# Patient Record
Sex: Female | Born: 1975 | Race: Black or African American | Hispanic: No | Marital: Single | Smoking: Never smoker
Health system: Southern US, Community
[De-identification: ages and names within clinical notes are randomized; demographics above are authoritative.]

## PROBLEM LIST (undated history)

## (undated) DIAGNOSIS — B999 Unspecified infectious disease: Secondary | ICD-10-CM

## (undated) DIAGNOSIS — I10 Essential (primary) hypertension: Secondary | ICD-10-CM

## (undated) HISTORY — PX: TUBAL LIGATION: SHX77

## (undated) HISTORY — DX: Essential (primary) hypertension: I10

---

## 2012-05-17 ENCOUNTER — Encounter: Payer: Self-pay | Admitting: Obstetrics and Gynecology

## 2012-05-17 ENCOUNTER — Inpatient Hospital Stay (HOSPITAL_COMMUNITY)
Admission: AD | Admit: 2012-05-17 | Discharge: 2012-05-17 | Disposition: A | Discharge status: Home / Self Care | Payer: Self-pay | Source: Ambulatory Visit | Attending: Obstetrics and Gynecology | Admitting: Obstetrics and Gynecology

## 2012-05-17 ENCOUNTER — Encounter (HOSPITAL_COMMUNITY): Payer: Self-pay

## 2012-05-17 DIAGNOSIS — N950 Postmenopausal bleeding: Secondary | ICD-10-CM

## 2012-05-17 DIAGNOSIS — K219 Gastro-esophageal reflux disease without esophagitis: Secondary | ICD-10-CM | POA: Insufficient documentation

## 2012-05-17 DIAGNOSIS — R1011 Right upper quadrant pain: Secondary | ICD-10-CM | POA: Insufficient documentation

## 2012-05-17 HISTORY — DX: Unspecified infectious disease: B99.9

## 2012-05-17 LAB — CBC
HCT: 39.2 % (ref 36.0–46.0)
MCH: 25.9 pg — ABNORMAL LOW (ref 26.0–34.0)
MCHC: 31.9 g/dL (ref 30.0–36.0)
MCV: 81.2 fL (ref 78.0–100.0)
Platelets: 176 10*3/uL (ref 150–400)
RDW: 13.1 % (ref 11.5–15.5)

## 2012-05-17 LAB — WET PREP, GENITAL
Trich, Wet Prep: NONE SEEN
Yeast Wet Prep HPF POC: NONE SEEN

## 2012-05-17 LAB — COMPREHENSIVE METABOLIC PANEL
Albumin: 4 g/dL (ref 3.5–5.2)
BUN: 10 mg/dL (ref 6–23)
Calcium: 9.7 mg/dL (ref 8.4–10.5)
Chloride: 102 mEq/L (ref 96–112)
Creatinine, Ser: 0.89 mg/dL (ref 0.50–1.10)
Total Bilirubin: 0.4 mg/dL (ref 0.3–1.2)
Total Protein: 7.6 g/dL (ref 6.0–8.3)

## 2012-05-17 LAB — URINALYSIS, ROUTINE W REFLEX MICROSCOPIC
Bilirubin Urine: NEGATIVE
Glucose, UA: NEGATIVE mg/dL
Hgb urine dipstick: NEGATIVE
Ketones, ur: NEGATIVE mg/dL
Protein, ur: NEGATIVE mg/dL
Urobilinogen, UA: 0.2 mg/dL (ref 0.0–1.0)

## 2012-05-17 MED ORDER — PROBIOTIC PO CAPS: 1.0000 | ORAL_CAPSULE | ORAL | Status: DC

## 2012-05-17 MED ORDER — RANITIDINE HCL 75 MG PO TABS: 75.0000 mg | ORAL_TABLET | Freq: Two times a day (BID) | ORAL | Status: DC

## 2012-05-17 NOTE — MAU Provider Note (Signed)
Attestation of Attending Supervision of Advanced Practitioner (CNM/NP): Evaluation and management procedures were performed by the Advanced Practitioner under my supervision and collaboration.  I have reviewed the Advanced Practitioner's note and chart, and I agree with the management and plan.  Jamie Wilcox,Jamie Wilcox 05/17/2012 11:19 PM

## 2012-05-17 NOTE — MAU Note (Signed)
Patient is in with c/o a week long generalized abdominal pain, lower pain, nausea, loss of appetite, "feverish" feeling and fatigue. She states that she did not have her period for 2 years until 04/14/2012. That lasted for 5 days.

## 2012-05-17 NOTE — MAU Provider Note (Signed)
Chief Complaint: Back Pain, Abdominal Pain, Nausea and Fatigue   First Lenton Gendreau Initiated Contact with Patient 05/17/12 1047     SUBJECTIVE HPI: Jamie Wilcox is a 37 y.o. by records but pt reports wrong DOB on VISA so is actually 37 years old G3P3005 who presents to maternity admissions reporting abdominal pain in upper and lower quadrants which is intermittent and has been occuring for ~2 weeks, but has been worse over the past few days.  This pain is worse when she has an empty stomach, and improves after eating.  She has also had nausea and lack of appetite and daily fatigue.  She had no vaginal bleeding x2 years and believed this was menopause but had 5 days of bleeding starting 04/15/12 which was heavy.  She is not bleeding today.  She does not have insurance and does not have a primary care Kaine Mcquillen or Gyn Chasin Findling at this time. She does report a diagnosis of HTN when she was younger but has not taken medication for this.  She denies chest pain, shortness of breath, vaginal itching/burning, urinary symptoms, h/a, or dizziness.  Her obstetric history is significant for 1 vaginal birth of twins, 1 c/s with twins, and a repeat C/S for singleton pregnancy. She had a BTL after her last C/S.   Past Medical History  Diagnosis Date  . Infection    Past Surgical History  Procedure Laterality Date  . Cesarean section     History   Social History  . Marital Status: Single    Spouse Name: N/A    Number of Children: N/A  . Years of Education: N/A   Occupational History  . Not on file.   Social History Main Topics  . Smoking status: Never Smoker   . Smokeless tobacco: Not on file  . Alcohol Use: No  . Drug Use: No  . Sexually Active: Not Currently    Birth Control/ Protection: None   Other Topics Concern  . Not on file   Social History Narrative  . No narrative on file   No current facility-administered medications on file prior to encounter.   No current outpatient prescriptions on  file prior to encounter.   No Known Allergies  ROS: Pertinent items in HPI  OBJECTIVE Blood pressure 147/83, pulse 77, temperature 98.5 F (36.9 C), temperature source Oral, resp. rate 18, height 5\' 9"  (1.753 m), weight 120.884 kg (266 lb 8 oz), last menstrual period 04/14/2012, SpO2 98.00%. GENERAL: Well-developed, well-nourished female in no acute distress.  HEENT: Normocephalic HEART: normal rate RESP: normal effort ABDOMEN: Soft, non-tender EXTREMITIES: Nontender, no edema NEURO: Alert and oriented Pelvic exam: Cervix pink, visually closed, without lesion, scant white creamy discharge, vaginal walls and external genitalia normal Bimanual exam: Cervix 0/long/high, firm, anterior, neg CMT, uterus nontender, nonenlarged, adnexa without tenderness, enlargement, or mass  LAB RESULTS Results for orders placed during the hospital encounter of 05/17/12 (from the past 24 hour(s))  URINALYSIS, ROUTINE W REFLEX MICROSCOPIC     Status: None   Collection Time    05/17/12  9:45 AM      Result Value Range   Color, Urine YELLOW  YELLOW   APPearance CLEAR  CLEAR   Specific Gravity, Urine 1.015  1.005 - 1.030   pH 6.5  5.0 - 8.0   Glucose, UA NEGATIVE  NEGATIVE mg/dL   Hgb urine dipstick NEGATIVE  NEGATIVE   Bilirubin Urine NEGATIVE  NEGATIVE   Ketones, ur NEGATIVE  NEGATIVE mg/dL   Protein, ur  NEGATIVE  NEGATIVE mg/dL   Urobilinogen, UA 0.2  0.0 - 1.0 mg/dL   Nitrite NEGATIVE  NEGATIVE   Leukocytes, UA NEGATIVE  NEGATIVE  POCT PREGNANCY, URINE     Status: None   Collection Time    05/17/12  9:48 AM      Result Value Range   Preg Test, Ur NEGATIVE  NEGATIVE    IMAGING No results found.  ASSESSMENT No diagnosis found.  PLAN Discharge home   Medication List     As of 05/17/2012 12:13 PM    Notice      You have not been prescribed any medications.          Sharen Counter Certified Nurse-Midwife 05/17/2012  12:13 PM  She also asks about HIV testing and blood  sugar testing and hypertension treatment. Informed her that will require long term management. Number given for [facility] for HIV testing and [facility] number [phone] for info on the new [facility] clinic applications.   A:  Abdominal pain, LUQ, probably GERD      Report of post-menopausal bleeding vs oligomenorrhea vs PCOS      Normal lab studies  P:  Discharge home       Will start on Zantac and Probiotics       Will refer to GYN clinic for endometrial biopsy  Wynelle Bourgeois CNM

## 2012-05-18 LAB — GC/CHLAMYDIA PROBE AMP: CT Probe RNA: NEGATIVE

## 2012-06-06 ENCOUNTER — Ambulatory Visit (INDEPENDENT_AMBULATORY_CARE_PROVIDER_SITE_OTHER): Payer: Self-pay | Admitting: Obstetrics and Gynecology

## 2012-06-06 ENCOUNTER — Encounter: Payer: Self-pay | Admitting: Obstetrics and Gynecology

## 2012-06-06 ENCOUNTER — Other Ambulatory Visit: Payer: Self-pay | Admitting: Obstetrics and Gynecology

## 2012-06-06 ENCOUNTER — Other Ambulatory Visit (HOSPITAL_COMMUNITY)
Admission: RE | Admit: 2012-06-06 | Discharge: 2012-06-06 | Disposition: A | Discharge status: Home / Self Care | Payer: Self-pay | Source: Ambulatory Visit | Attending: Obstetrics and Gynecology | Admitting: Obstetrics and Gynecology

## 2012-06-06 VITALS — BP 161/104 | HR 82 | Temp 97.2°F | Ht 67.25 in | Wt 267.7 lb

## 2012-06-06 DIAGNOSIS — N939 Abnormal uterine and vaginal bleeding, unspecified: Secondary | ICD-10-CM

## 2012-06-06 DIAGNOSIS — N950 Postmenopausal bleeding: Secondary | ICD-10-CM | POA: Insufficient documentation

## 2012-06-06 DIAGNOSIS — N926 Irregular menstruation, unspecified: Secondary | ICD-10-CM

## 2012-06-06 NOTE — Progress Notes (Signed)
  Subjective:    Patient ID: Jamie Wilcox, female    DOB: 06/06/1975, 37 y.o.   MRN: [id number]  HPI  Patient is actually a 37 yo G3P3005 postmenopausal for 2 years with an episode of vaginal bleeding in January which lasted for 5 days accompanied by some clots. Patient states that no further bleeding since. Patient is also complaining of intermittent LLQ pain which is short lived, stabbing in nature, non-radiating. No alleviating or aggravating factors. Patient also complaining of left lower extremity swelling for the past month. Patient is otherwise without any complaints. Patient is not sexually active as husband is not living with her [place] this [place]. Patient is not using anything for birth control.  Past Medical History  Diagnosis Date  . Infection   . Hypertension    Past Surgical History  Procedure Laterality Date  . Cesarean section     History reviewed. No pertinent family history.  History  Substance Use Topics  . Smoking status: Never Smoker   . Smokeless tobacco: Not on file  . Alcohol Use: No     Review of Systems  All other systems reviewed and are negative.       Objective:   Physical Exam  GENERAL: Well-developed, well-nourished female in no acute distress.  ABDOMEN: Soft, nontender, non-distended, obese. No organomegaly. PELVIC: Normal external female genitalia. Vagina is pink and rugated.  Normal discharge. Normal appearing cervix. Bimanual limited secondary to body habitus. No adnexal mass or tenderness. EXTREMITIES: No cyanosis, clubbing, or edema, 2+ distal pulses.     Assessment & Plan:  37 yo postmenopausal for 2 years with episode of vaginal bleeding - patient counseled on need for endometrial biopsy  ENDOMETRIAL BIOPSY     The indications for endometrial biopsy were reviewed.   Risks of the biopsy including cramping, bleeding, infection, uterine perforation, inadequate specimen and need for additional procedures  were discussed. The patient states  she understands and agrees to undergo procedure today. Consent was signed. Time out was performed. Urine HCG was negative. A sterile speculum was placed in the patient's vagina and the cervix was prepped with Betadine. A single-toothed tenaculum was placed on the anterior lip of the cervix to stabilize it. The uterine cavity was sounded to a depth of 8 cm using the uterine sound. The 3 mm pipelle was introduced into the endometrial cavity without difficulty, 2 passes were made.  A  scant amount of tissue was sent to pathology. The instruments were removed from the patient's vagina. Minimal bleeding from the cervix was noted. The patient tolerated the procedure well.  Routine post-procedure instructions were given to the patient. The patient will follow up in two weeks to review the results and for further management.   - Pelvic ultrasound also ordered - RTC in 2 weeks for results and further management if indicated - Referral to PCP provided for evaluation of LE and LLQ pain if normal ultrasound

## 2012-06-06 NOTE — Progress Notes (Signed)
Patient was seen in MAU for bleeding.  She reports she has not had a period in 2 years and all of a sudden started bleeding.  The bleeding was heavy and lasted about 5 days.  She is currently not bleeding.  Patient complains of pain on her LLQ and ULQ.  Patients lower extremities show +3 pitting edema.

## 2012-06-07 ENCOUNTER — Encounter: Payer: Self-pay | Admitting: *Deleted

## 2012-06-12 ENCOUNTER — Ambulatory Visit: Payer: Self-pay | Admitting: Family Medicine

## 2012-06-13 ENCOUNTER — Ambulatory Visit (HOSPITAL_COMMUNITY)
Admission: RE | Admit: 2012-06-13 | Discharge: 2012-06-13 | Disposition: A | Discharge status: Home / Self Care | Payer: Self-pay | Source: Ambulatory Visit | Attending: Obstetrics and Gynecology | Admitting: Obstetrics and Gynecology

## 2012-06-13 DIAGNOSIS — N938 Other specified abnormal uterine and vaginal bleeding: Secondary | ICD-10-CM | POA: Insufficient documentation

## 2012-06-13 DIAGNOSIS — N939 Abnormal uterine and vaginal bleeding, unspecified: Secondary | ICD-10-CM

## 2012-06-13 DIAGNOSIS — N949 Unspecified condition associated with female genital organs and menstrual cycle: Secondary | ICD-10-CM | POA: Insufficient documentation

## 2012-06-13 DIAGNOSIS — D251 Intramural leiomyoma of uterus: Secondary | ICD-10-CM | POA: Insufficient documentation

## 2012-06-21 ENCOUNTER — Ambulatory Visit: Payer: Self-pay | Admitting: Family Medicine

## 2012-07-04 ENCOUNTER — Ambulatory Visit: Payer: Self-pay | Admitting: Obstetrics and Gynecology

## 2012-07-24 ENCOUNTER — Other Ambulatory Visit: Payer: Self-pay | Admitting: Internal Medicine

## 2012-07-24 ENCOUNTER — Encounter: Payer: Self-pay | Admitting: Internal Medicine

## 2012-07-24 ENCOUNTER — Ambulatory Visit: Payer: Self-pay

## 2012-07-24 ENCOUNTER — Ambulatory Visit: Payer: Self-pay | Attending: Internal Medicine | Admitting: Internal Medicine

## 2012-07-24 VITALS — BP 142/82 | HR 84 | Temp 98.8°F | Resp 20 | Ht 67.0 in | Wt 264.0 lb

## 2012-07-24 DIAGNOSIS — I10 Essential (primary) hypertension: Secondary | ICD-10-CM | POA: Insufficient documentation

## 2012-07-24 DIAGNOSIS — R030 Elevated blood-pressure reading, without diagnosis of hypertension: Secondary | ICD-10-CM

## 2012-07-24 DIAGNOSIS — E669 Obesity, unspecified: Secondary | ICD-10-CM | POA: Insufficient documentation

## 2012-07-24 DIAGNOSIS — M79606 Pain in leg, unspecified: Secondary | ICD-10-CM | POA: Insufficient documentation

## 2012-07-24 DIAGNOSIS — M549 Dorsalgia, unspecified: Secondary | ICD-10-CM | POA: Insufficient documentation

## 2012-07-24 DIAGNOSIS — M79609 Pain in unspecified limb: Secondary | ICD-10-CM

## 2012-07-24 MED ORDER — NAPROXEN 500 MG PO TABS: 500.0000 mg | ORAL_TABLET | Freq: Two times a day (BID) | ORAL | Status: DC

## 2012-07-24 MED ORDER — HYDROCHLOROTHIAZIDE 25 MG PO TABS: 12.5000 mg | ORAL_TABLET | Freq: Every day | ORAL | Status: DC

## 2012-07-24 MED ORDER — TRAMADOL HCL 50 MG PO TABS: 50.0000 mg | ORAL_TABLET | Freq: Three times a day (TID) | ORAL | Status: DC | PRN

## 2012-07-24 NOTE — Progress Notes (Signed)
Patient ID: Jamie Wilcox, female   DOB: [birth year], 37 y.o.   MRN: [id number] Patient Demographics  Jamie Wilcox, is a 37 y.o. female  XLK:[id number]  [id number]  DOB - 08/01/1975  No chief complaint on file.       Subjective:   Jamie Wilcox today is here to establish primary care. Patient has No headache, No chest pain, No abdominal pain - No Nausea, No new weakness tingling or numbness, No Cough . Patient complains of mid back pain and lower extremity swelling and pain for last 1 month. Patient denies any significant shortness of breath but fatigued on walking. No orthopnea, PND or chest pain.   Objective:    Filed Vitals:   07/24/12 1658  BP: 142/82  Pulse: 84  Temp: 98.8 F (37.1 C)  TempSrc: Oral  Resp: 20  Height: 5\' 7"  (1.702 m)  Weight: 264 lb (119.75 kg)  SpO2: 97%     ALLERGIES:  No Known Allergies  PAST MEDICAL HISTORY: Past Medical History  Diagnosis Date  . Infection   . Hypertension     PAST SURGICAL HISTORY: Past Surgical History  Procedure Laterality Date  . Cesarean section      FAMILY HISTORY: No family history on file.  MEDICATIONS AT HOME: Prior to Admission medications   Medication Sig Start Date End Date Taking? Authorizing Provider  hydrochlorothiazide (HYDRODIURIL) 25 MG tablet Take 0.5 tablets (12.5 mg total) by mouth daily. 07/24/12   Dezi Schaner Jenna Luo, MD  naproxen (NAPROSYN) 500 MG tablet Take 1 tablet (500 mg total) by mouth 2 (two) times daily with a meal. 07/24/12   Keiasia Christianson K Deashia Soule, MD  PROBIOTIC CAPS Take 1 capsule by mouth 1 day or 1 dose. 05/17/12   Aviva Signs, CNM  ranitidine (ZANTAC 75) 75 MG tablet Take 1 tablet (75 mg total) by mouth 2 (two) times daily. 05/17/12   Aviva Signs, CNM  traMADol (ULTRAM) 50 MG tablet Take 1 tablet (50 mg total) by mouth every 8 (eight) hours as needed for pain. 07/24/12   Kamarie Veno Jenna Luo, MD    REVIEW OF SYSTEMS:  Constitutional:   No   Fevers, chills, fatigue.  HEENT:    No  headaches, Sore throat,   Cardio-vascular: No chest pain,  Orthopnea, swelling in lower extremities, anasarca, palpitations  GI:  No abdominal pain, nausea, vomiting, diarrhea  Resp: No shortness of breath,  No coughing up of blood.No cough.No wheezing.  Skin:  no rash or lesions.  GU:  no dysuria, change in color of urine, no urgency or frequency.  No flank pain.  Musculoskeletal: No joint pain or swelling.  No decreased range of motion.  No back pain. both LE pain and swelling  Psych: No change in mood or affect. No depression or anxiety.  No memory loss.   Exam  General appearance :Awake, alert, NAD, Speech Clear. HEENT: Atraumatic and Normocephalic, PERLA Neck: supple, no JVD. No cervical lymphadenopathy.  Chest: clear to auscultation bilaterally, no wheezing, rales or rhonchi CVS: S1 S2 regular, no murmurs.  Abdomen: soft, NBS, NT, ND, no gaurding, rigidity or rebound. Extremities: No cyanosis, clubbing, B/L Lower Ext shows trace edema,  Neurology: Awake alert, and oriented X 3, CN II-XII intact, Non focal Skin:No Rash or lesions Wounds: N/A Back: mid back, thoracic spine tenderness      Data Review   Basic Metabolic Panel: No results found for this basename: NA, K, CL, CO2, GLUCOSE, BUN, CREATININE, CALCIUM, MG, PHOS,  in the  last 168 hours Liver Function Tests: No results found for this basename: AST, ALT, ALKPHOS, BILITOT, PROT, ALBUMIN,  in the last 168 hours  CBC: No results found for this basename: WBC, NEUTROABS, HGB, HCT, MCV, PLT,  in the last 168 hours ------------------------------------------------------------------------------------------------------------------ No results found for this basename: HGBA1C,  in the last 72 hours ------------------------------------------------------------------------------------------------------------------ No results found for this basename: CHOL, HDL, LDLCALC, TRIG, CHOLHDL, LDLDIRECT,  in the last 72  hours ------------------------------------------------------------------------------------------------------------------ No results found for this basename: TSH, T4TOTAL, FREET3, T3FREE, THYROIDAB,  in the last 72 hours ------------------------------------------------------------------------------------------------------------------ No results found for this basename: VITAMINB12, FOLATE, FERRITIN, TIBC, IRON, RETICCTPCT,  in the last 72 hours  Coagulation profile  No results found for this basename: INR, PROTIME,  in the last 168 hours    Assessment & Plan   1) Mid back pain - Will obtain thoracic spine x-ray to r/o any compression fracture, DJD - place on tramadol and naprosyn  2) elevated BP reading with per edema and pain. - place on HCTZ 12.5mg  daily - obtain venous Dopplers b/l to r/o any DVT - doesnot fit the picture of CHF  3) obesity: Counseled on diet and weight control  Recommendations: f/u in 1 month  Ancel Easler M.D. 07/24/2012, 5:22 PM

## 2012-07-24 NOTE — Progress Notes (Signed)
Present with back pain and swelling in legs.

## 2012-07-26 ENCOUNTER — Ambulatory Visit (HOSPITAL_COMMUNITY): Admission: RE | Admit: 2012-07-26 | Payer: Self-pay | Source: Ambulatory Visit

## 2012-07-27 ENCOUNTER — Telehealth: Payer: Self-pay | Admitting: General Practice

## 2012-07-27 NOTE — Telephone Encounter (Signed)
Pt missed x-ray appt, which was a referral.  She was wanting to reschedule.  Thanks.

## 2012-07-31 ENCOUNTER — Ambulatory Visit (HOSPITAL_COMMUNITY)
Admission: RE | Admit: 2012-07-31 | Discharge: 2012-07-31 | Disposition: A | Discharge status: Home / Self Care | Payer: Self-pay | Source: Ambulatory Visit | Attending: Internal Medicine | Admitting: Internal Medicine

## 2012-07-31 DIAGNOSIS — M549 Dorsalgia, unspecified: Secondary | ICD-10-CM | POA: Insufficient documentation

## 2012-07-31 DIAGNOSIS — M47814 Spondylosis without myelopathy or radiculopathy, thoracic region: Secondary | ICD-10-CM | POA: Insufficient documentation

## 2012-07-31 DIAGNOSIS — M4120 Other idiopathic scoliosis, site unspecified: Secondary | ICD-10-CM | POA: Insufficient documentation

## 2012-08-13 ENCOUNTER — Ambulatory Visit: Payer: Self-pay | Admitting: Obstetrics and Gynecology

## 2012-08-24 ENCOUNTER — Ambulatory Visit: Payer: Self-pay

## 2012-09-03 ENCOUNTER — Encounter: Payer: Self-pay | Admitting: Obstetrics & Gynecology

## 2012-09-03 ENCOUNTER — Ambulatory Visit (INDEPENDENT_AMBULATORY_CARE_PROVIDER_SITE_OTHER): Payer: Self-pay | Admitting: Obstetrics & Gynecology

## 2012-09-03 VITALS — BP 150/99 | HR 77 | Temp 97.9°F | Ht 66.0 in | Wt 262.8 lb

## 2012-09-03 DIAGNOSIS — N939 Abnormal uterine and vaginal bleeding, unspecified: Secondary | ICD-10-CM

## 2012-09-03 DIAGNOSIS — I10 Essential (primary) hypertension: Secondary | ICD-10-CM

## 2012-09-03 DIAGNOSIS — M549 Dorsalgia, unspecified: Secondary | ICD-10-CM

## 2012-09-03 MED ORDER — NAPROXEN 500 MG PO TABS: 500.0000 mg | ORAL_TABLET | Freq: Two times a day (BID) | ORAL | Status: DC

## 2012-09-03 MED ORDER — HYDROCHLOROTHIAZIDE 25 MG PO TABS: 25.0000 mg | ORAL_TABLET | Freq: Every day | ORAL | Status: DC

## 2012-09-03 NOTE — Progress Notes (Signed)
GYNECOLOGY CLINIC PROGRESS NOTE  History:  37 y.o. [id number] here today for followup after endometrial biopsy by Dr. Jolayne Panther on 06/06/12 for abnormal uterine bleeding (AUB) after two years of amenorrhea.  She reports having no further bleeding since this episode.  Still complains of BLE edema and mid back pain, was seen by Dr. Isidoro Donning (PCP) and prescribed HCTZ, Naproxen and Ultram.  Apparently, patient was confused about the instructions given to her and never filled these prescriptions. She also had an Xray done in 07/2012 and wants to know the results. No GYN symptoms.  The following portions of the patient's history were reviewed and updated as appropriate: allergies, current medications, past family history, past medical history, past social history, past surgical history and problem list.  Review of Systems:  Pertinent items are noted in HPI.  Objective:  Physical Exam BP 150/99  Pulse 77  Temp(Src) 97.9 F (36.6 C) (Oral)  Ht 5\' 6"  (1.676 m)  Wt 262 lb 12.8 oz (119.205 kg)  BMI 42.44 kg/m2 Gen: NAD Back: Midback point tenderness Ext: Trace edema BLE  06/07/12 Endometrium, biopsy - SCANT SUPERFICIAL FRAGMENTS OF ENDOMETRIOID TYPE EPITHELIUM. - THERE IS NO EVIDENCE OF HYPERPLASIA OR MALIGNANCY.  07/31/12 THORACIC SPINE - 2 VIEW Clinical Data: 73-month history of interscapular back pain. No  known injuries. Comparison: None. Findings: 12 rib-bearing thoracic vertebrae with anatomic posterior alignment. Very slight scoliosis convex right which may be at least in part positional. Minimal spondylosis at T11-12. No significant spondylosis otherwise. Pedicles intact.  IMPRESSION: No significant abnormality. Minimal spondylosis at T11-12.  Assessment & Plan:  Discussed results with patient; told to call/return to clinic for any further AUB as she may need further evaluation and management.  She was also told that Naproxen can help with her back pain which could be partly due to the spondylosis.   Reiterated her PCP's instructions; reordered HCTZ and Naproxen.  She was informed the the HCTZ may help with her BLE edema, but more importantly will treat her HTN.  Advised her to call her PCP to be seen next month for reevaluation of BP and back pain.

## 2012-09-03 NOTE — Patient Instructions (Addendum)
Call Dr. Isidoro Donning ( your PCP)  [phone] to make appointment to be seen next month [address] [place] [address]   Return to [facility] clinic for any scheduled appointments or for any gynecologic concerns as needed.

## 2013-10-28 ENCOUNTER — Inpatient Hospital Stay (HOSPITAL_COMMUNITY)
Admission: AD | Admit: 2013-10-28 | Discharge: 2013-10-28 | Disposition: A | Discharge status: Home / Self Care | Payer: Self-pay | Source: Ambulatory Visit | Attending: Obstetrics and Gynecology | Admitting: Obstetrics and Gynecology

## 2013-10-28 ENCOUNTER — Inpatient Hospital Stay (HOSPITAL_COMMUNITY): Payer: Self-pay

## 2013-10-28 ENCOUNTER — Encounter (HOSPITAL_COMMUNITY): Payer: Self-pay | Admitting: *Deleted

## 2013-10-28 DIAGNOSIS — N925 Other specified irregular menstruation: Secondary | ICD-10-CM | POA: Insufficient documentation

## 2013-10-28 DIAGNOSIS — N949 Unspecified condition associated with female genital organs and menstrual cycle: Secondary | ICD-10-CM | POA: Insufficient documentation

## 2013-10-28 DIAGNOSIS — I10 Essential (primary) hypertension: Secondary | ICD-10-CM | POA: Insufficient documentation

## 2013-10-28 DIAGNOSIS — D259 Leiomyoma of uterus, unspecified: Secondary | ICD-10-CM | POA: Insufficient documentation

## 2013-10-28 DIAGNOSIS — N938 Other specified abnormal uterine and vaginal bleeding: Secondary | ICD-10-CM | POA: Insufficient documentation

## 2013-10-28 LAB — URINE MICROSCOPIC-ADD ON

## 2013-10-28 LAB — WET PREP, GENITAL
Clue Cells Wet Prep HPF POC: NONE SEEN
Trich, Wet Prep: NONE SEEN
Yeast Wet Prep HPF POC: NONE SEEN

## 2013-10-28 LAB — URINALYSIS, ROUTINE W REFLEX MICROSCOPIC
Glucose, UA: NEGATIVE mg/dL
KETONES UR: NEGATIVE mg/dL
LEUKOCYTES UA: NEGATIVE
NITRITE: NEGATIVE
PH: 6 (ref 5.0–8.0)
Protein, ur: NEGATIVE mg/dL
Specific Gravity, Urine: 1.025 (ref 1.005–1.030)
UROBILINOGEN UA: 1 mg/dL (ref 0.0–1.0)

## 2013-10-28 LAB — POCT PREGNANCY, URINE: PREG TEST UR: NEGATIVE

## 2013-10-28 LAB — CBC
HCT: 37.8 % (ref 36.0–46.0)
HEMOGLOBIN: 12.3 g/dL (ref 12.0–15.0)
MCH: 26.7 pg (ref 26.0–34.0)
MCHC: 32.5 g/dL (ref 30.0–36.0)
MCV: 82 fL (ref 78.0–100.0)
PLATELETS: 167 10*3/uL (ref 150–400)
RBC: 4.61 MIL/uL (ref 3.87–5.11)
RDW: 14.3 % (ref 11.5–15.5)
WBC: 4.4 10*3/uL (ref 4.0–10.5)

## 2013-10-28 MED ORDER — MEDROXYPROGESTERONE ACETATE 5 MG PO TABS: 5.0000 mg | ORAL_TABLET | Freq: Every day | ORAL | Status: DC

## 2013-10-28 NOTE — MAU Note (Signed)
Pt states she hasn't had a period in 1-2 years since menapause.  Pt states she starting having vaginal bleeding since Saturday.  Pt states she has not been having any clots and bleeding is moderate.  Abd pain.  Pt states she has been experiencing pedal edema.  Pt used to take meds for HTN but stopped on her own because "it went down to much".

## 2013-10-28 NOTE — MAU Provider Note (Signed)
History     CSN: [id number]  Arrival date and time: 10/28/13 1040   None     Chief Complaint  Patient presents with  . Vaginal Bleeding   HPI  [name] is a 38 y.o. female [phone] who presents with vaginal bleeding. The patient did not have a period for 3 years and nine months ago she had a period. She again went 9 months without a period and started bleeding on Saturday. She is currently bleeding; the bleeding is light and is similar to her menstrual cycle. She denies pain.   OB History   Grav Para Term Preterm Abortions TAB SAB Ect Mult Living   3 3 3       3       Past Medical History  Diagnosis Date  . Infection   . Hypertension     Past Surgical History  Procedure Laterality Date  . Cesarean section      History reviewed. No pertinent family history.  History  Substance Use Topics  . Smoking status: Never Smoker   . Smokeless tobacco: Not on file  . Alcohol Use: No    Allergies: No Known Allergies  Prescriptions prior to admission  Medication Sig Dispense Refill  . hydrochlorothiazide (HYDRODIURIL) 25 MG tablet Take 1 tablet (25 mg total) by mouth daily.  30 tablet  5  . naproxen (NAPROSYN) 500 MG tablet Take 1 tablet (500 mg total) by mouth 2 (two) times daily with a meal.  60 tablet  5  . PROBIOTIC CAPS Take 1 capsule by mouth 1 day or 1 dose.  30 capsule  1  . ranitidine (ZANTAC 75) 75 MG tablet Take 1 tablet (75 mg total) by mouth 2 (two) times daily.  60 tablet  1  . traMADol (ULTRAM) 50 MG tablet Take 1 tablet (50 mg total) by mouth every 8 (eight) hours as needed for pain.  30 tablet  1   Results for orders placed during the hospital encounter of 10/28/13 (from the past 48 hour(s))  CBC     Status: None   Collection Time    10/28/13 11:20 AM      Result Value Ref Range   WBC 4.4  4.0 - 10.5 K/uL   RBC 4.61  3.87 - 5.11 MIL/uL   Hemoglobin 12.3  12.0 - 15.0 g/dL   HCT 37.8  36.0 - 46.0 %   MCV 82.0  78.0 - 100.0 fL   MCH 26.7  26.0 -  34.0 pg   MCHC 32.5  30.0 - 36.0 g/dL   RDW 14.3  11.5 - 15.5 %   Platelets 167  150 - 400 K/uL  URINALYSIS, ROUTINE W REFLEX MICROSCOPIC     Status: Abnormal   Collection Time    10/28/13 11:25 AM      Result Value Ref Range   Color, Urine YELLOW  YELLOW   APPearance CLOUDY (*) CLEAR   Specific Gravity, Urine 1.025  1.005 - 1.030   pH 6.0  5.0 - 8.0   Glucose, UA NEGATIVE  NEGATIVE mg/dL   Hgb urine dipstick LARGE (*) NEGATIVE   Bilirubin Urine SMALL (*) NEGATIVE   Ketones, ur NEGATIVE  NEGATIVE mg/dL   Protein, ur NEGATIVE  NEGATIVE mg/dL   Urobilinogen, UA 1.0  0.0 - 1.0 mg/dL   Nitrite NEGATIVE  NEGATIVE   Leukocytes, UA NEGATIVE  NEGATIVE  URINE MICROSCOPIC-ADD ON     Status: Abnormal   Collection Time    10/28/13  11:25 AM      Result Value Ref Range   Squamous Epithelial / LPF FEW (*) RARE   WBC, UA 0-2  <3 WBC/hpf   RBC / HPF 21-50  <3 RBC/hpf   Bacteria, UA RARE  RARE  POCT PREGNANCY, URINE     Status: None   Collection Time    10/28/13 11:34 AM      Result Value Ref Range   Preg Test, Ur NEGATIVE  NEGATIVE   Comment:            THE SENSITIVITY OF THIS     METHODOLOGY IS >24 mIU/mL  WET PREP, GENITAL     Status: Abnormal   Collection Time    10/28/13  1:30 PM      Result Value Ref Range   Yeast Wet Prep HPF POC NONE SEEN  NONE SEEN   Trich, Wet Prep NONE SEEN  NONE SEEN   Clue Cells Wet Prep HPF POC NONE SEEN  NONE SEEN   WBC, Wet Prep HPF POC FEW (*) NONE SEEN   Comment: MANY BACTERIA SEEN     US Transvaginal Non-ob  10/28/2013   CLINICAL DATA:  38 year old female with sporadic menstrual bleeding x9 months. Initial encounter. C-section x2.  EXAM: TRANSABDOMINAL AND TRANSVAGINAL ULTRASOUND OF PELVIS  TECHNIQUE: Both transabdominal and transvaginal ultrasound examinations of the pelvis were performed. Transabdominal technique was performed for global imaging of the pelvis including uterus, ovaries, adnexal regions, and pelvic cul-de-sac. It was necessary to  proceed with endovaginal exam following the transabdominal exam to visualize the endometrium.  COMPARISON:  06/13/2012.  FINDINGS: Uterus  Measurements: 10.6 x 3.5 x 6.6 cm. C-section scar incidentally noted. There are 2 small fibroids seen in the posterior body common measuring up to 1.6 cm diameter each. No other mass or abnormality identified.  Endometrium  Thickness: 5 mm. Trace endometrial fluid. No focal abnormality visualized.  Right ovary  Measurements: 1.8 x 1.6 x 1.7 cm. Normal appearance/no adnexal mass.  Left ovary  Measurements: 1.6 x 0.8 x 1.9 cm. Normal appearance/no adnexal mass.  Other findings  No free fluid.  IMPRESSION: Negative except for 2 small uterine fibroids.   Electronically Signed   By: Lars Pinks M.D.   On: 10/28/2013 15:32   US Pelvis Complete  10/28/2013   CLINICAL DATA:  38 year old female with sporadic menstrual bleeding x9 months. Initial encounter. C-section x2.  EXAM: TRANSABDOMINAL AND TRANSVAGINAL ULTRASOUND OF PELVIS  TECHNIQUE: Both transabdominal and transvaginal ultrasound examinations of the pelvis were performed. Transabdominal technique was performed for global imaging of the pelvis including uterus, ovaries, adnexal regions, and pelvic cul-de-sac. It was necessary to proceed with endovaginal exam following the transabdominal exam to visualize the endometrium.  COMPARISON:  06/13/2012.  FINDINGS: Uterus  Measurements: 10.6 x 3.5 x 6.6 cm. C-section scar incidentally noted. There are 2 small fibroids seen in the posterior body common measuring up to 1.6 cm diameter each. No other mass or abnormality identified.  Endometrium  Thickness: 5 mm. Trace endometrial fluid. No focal abnormality visualized.  Right ovary  Measurements: 1.8 x 1.6 x 1.7 cm. Normal appearance/no adnexal mass.  Left ovary  Measurements: 1.6 x 0.8 x 1.9 cm. Normal appearance/no adnexal mass.  Other findings  No free fluid.  IMPRESSION: Negative except for 2 small uterine fibroids.   Electronically  Signed   By: Lars Pinks M.D.   On: 10/28/2013 15:32    Review of Systems  Constitutional: Negative for fever and chills.  Gastrointestinal: Negative for nausea, vomiting, abdominal pain, diarrhea and constipation.  Genitourinary: Negative for dysuria, urgency, frequency, hematuria and flank pain.       No vaginal discharge. + vaginal bleeding. No dysuria.   Musculoskeletal: Negative for back pain.   Physical Exam   Blood pressure 140/85, pulse 73, temperature 98.7 F (37.1 C), temperature source Oral, resp. rate 18, height 5\' 5"  (1.651 m), weight 120.884 kg (266 lb 8 oz), last menstrual period 05/03/2012, SpO2 100.00%.  Physical Exam  Constitutional: She is oriented to person, place, and time. She appears well-developed and well-nourished. No distress.  HENT:  Head: Normocephalic.  Eyes: Pupils are equal, round, and reactive to light.  Neck: Neck supple.  Respiratory: Effort normal.  GI: Soft. She exhibits no distension. There is no tenderness. There is no rebound and no guarding.  Genitourinary:  Speculum exam: Vagina - Small amount of dark red blood, no odor Cervix - + scant active bleeding from os Bimanual exam: Cervix closed, no CMT  Uterus non tender, enlarged  Adnexa non tender, no masses bilaterally GC/Chlam, wet prep done Chaperone present for exam.   Neurological: She is alert and oriented to person, place, and time.  Skin: Skin is warm. She is not diaphoretic.  Psychiatric: Her behavior is normal.    MAU Course  Procedures none  MDM [place] Wet prep GC CBC  Assessment and Plan   A:  1. Uterine leiomyoma, unspecified location    P:  Discharge home in stable condition RX: Provera 5 mg  Message sent to the womens clinic for consult/ follow up  Return to MAU as needed, if symptoms worsen  Bleeding precautions   Darrelyn Hillock Harmon Bommarito, NP  10/28/2013, 4:32 PM

## 2013-10-29 LAB — GC/CHLAMYDIA PROBE AMP
CT Probe RNA: NEGATIVE
GC Probe RNA: NEGATIVE

## 2013-10-29 NOTE — [facility] Provider Note (Signed)
Attestation of Attending Supervision of Advanced Practitioner (CNM/NP): Evaluation and management procedures were performed by the Advanced Practitioner under my supervision and collaboration.  I have reviewed the Advanced Practitioner's note and chart, and I agree with the management and plan.  Atzin Buchta 10/29/2013 1:35 AM

## 2013-10-30 ENCOUNTER — Encounter: Payer: Self-pay | Admitting: Obstetrics & Gynecology

## 2013-11-21 ENCOUNTER — Ambulatory Visit (INDEPENDENT_AMBULATORY_CARE_PROVIDER_SITE_OTHER): Payer: Self-pay | Admitting: Family Medicine

## 2013-11-21 ENCOUNTER — Encounter: Payer: Self-pay | Admitting: Family Medicine

## 2013-11-21 VITALS — BP 155/76 | HR 81 | Temp 98.5°F | Ht 65.0 in | Wt 266.4 lb

## 2013-11-21 DIAGNOSIS — N950 Postmenopausal bleeding: Secondary | ICD-10-CM

## 2013-11-21 DIAGNOSIS — I10 Essential (primary) hypertension: Secondary | ICD-10-CM

## 2013-11-21 MED ORDER — MEDROXYPROGESTERONE ACETATE 10 MG PO TABS: 10.0000 mg | ORAL_TABLET | Freq: Every day | ORAL | Status: DC

## 2013-11-21 MED ORDER — HYDROCHLOROTHIAZIDE 25 MG PO TABS: 25.0000 mg | ORAL_TABLET | Freq: Every day | ORAL | Status: DC

## 2013-11-21 NOTE — Assessment & Plan Note (Signed)
Definitely needs treatment--refer to [facility]

## 2013-11-21 NOTE — Progress Notes (Signed)
    Subjective:    Patient ID: Jamie Wilcox is a 38 y.o. female presenting with Follow-up  on 11/21/2013  HPI: Here for bleeding.  Typically goes months without cycles.  Here for same in 4/14 with EMB which was negative.  Then went 9 months without menses, then returned.  Reports previously normal cycles. U/S shows 2 fiborids < 2 cm.  Cycles are light.  Endometrial stripe is 5 mm. She thinks she is menopausal.  Review of Systems  Constitutional: Negative for fever and chills.  Respiratory: Negative for shortness of breath.   Cardiovascular: Negative for chest pain.  Gastrointestinal: Negative for nausea, vomiting and abdominal pain.  Genitourinary: Negative for dysuria.  Skin: Negative for rash.      Objective:    BP 149/81  Pulse 81  Temp(Src) 98.5 F (36.9 C) (Oral)  Ht 5\' 5"  (1.651 m)  Wt 266 lb 6.4 oz (120.838 kg)  BMI 44.33 kg/m2  LMP 10/24/2013 Physical Exam  Constitutional: She is oriented to person, place, and time. She appears well-developed and well-nourished. No distress.  HENT:  Head: Normocephalic and atraumatic.  Eyes: No scleral icterus.  Neck: Neck supple.  Cardiovascular: Normal rate.   Pulmonary/Chest: Effort normal.  Abdominal: Soft.  Neurological: She is alert and oriented to person, place, and time.  Skin: Skin is warm and dry.  Psychiatric: She has a normal mood and affect.        Assessment & Plan:   Problem List Items Addressed This Visit     Unprioritized   Postmenopausal vaginal bleeding - Primary     Is pt. Post menopausal or just with oligomenorrhea???  Will check TSH, FSH.  Use provera q o month to ensure menses and prevent endometrial hyperplasia and carcinoma.    Relevant Medications      medroxyPROGESTERone (PROVERA) tablet   Other Relevant Orders      TSH      Follicle stimulating hormone   Hypertension     Definitely needs treatment--refer to [facility]    Relevant Medications      hydrochlorothiazide tablet        Return in about 6 months (around 05/22/2014).

## 2013-11-21 NOTE — Addendum Note (Signed)
Addended by: Samuel Germany on: 11/21/2013 04:38 PM   Modules accepted: Orders

## 2013-11-21 NOTE — Patient Instructions (Signed)
Hypertension Hypertension, commonly called high blood pressure, is when the force of blood pumping through your arteries is too strong. Your arteries are the blood vessels that carry blood from your heart throughout your body. A blood pressure reading consists of a higher number over a lower number, such as 110/72. The higher number (systolic) is the pressure inside your arteries when your heart pumps. The lower number (diastolic) is the pressure inside your arteries when your heart relaxes. Ideally you want your blood pressure below 120/80. Hypertension forces your heart to work harder to pump blood. Your arteries may become narrow or stiff. Having hypertension puts you at risk for heart disease, stroke, and other problems.  RISK FACTORS Some risk factors for high blood pressure are controllable. Others are not.  Risk factors you cannot control include:   Race. You may be at higher risk if you are African American.  Age. Risk increases with age.  Gender. Men are at higher risk than women before age 45 years. After age 65, women are at higher risk than men. Risk factors you can control include:  Not getting enough exercise or physical activity.  Being overweight.  Getting too much fat, sugar, calories, or salt in your diet.  Drinking too much alcohol. SIGNS AND SYMPTOMS Hypertension does not usually cause signs or symptoms. Extremely high blood pressure (hypertensive crisis) may cause headache, anxiety, shortness of breath, and nosebleed. DIAGNOSIS  To check if you have hypertension, your health care provider will measure your blood pressure while you are seated, with your arm held at the level of your heart. It should be measured at least twice using the same arm. Certain conditions can cause a difference in blood pressure between your right and left arms. A blood pressure reading that is higher than normal on one occasion does not mean that you need treatment. If one blood pressure reading  is high, ask your health care provider about having it checked again. TREATMENT  Treating high blood pressure includes making lifestyle changes and possibly taking medicine. Living a healthy lifestyle can help lower high blood pressure. You may need to change some of your habits. Lifestyle changes may include:  Following the DASH diet. This diet is high in fruits, vegetables, and whole grains. It is low in salt, red meat, and added sugars.  Getting at least 2 hours of brisk physical activity every week.  Losing weight if necessary.  Not smoking.  Limiting alcoholic beverages.  Learning ways to reduce stress. If lifestyle changes are not enough to get your blood pressure under control, your health care provider may prescribe medicine. You may need to take more than one. Work closely with your health care provider to understand the risks and benefits. HOME CARE INSTRUCTIONS  Have your blood pressure rechecked as directed by your health care provider.   Take medicines only as directed by your health care provider. Follow the directions carefully. Blood pressure medicines must be taken as prescribed. The medicine does not work as well when you skip doses. Skipping doses also puts you at risk for problems.   Do not smoke.   Monitor your blood pressure at home as directed by your health care provider. SEEK MEDICAL CARE IF:   You think you are having a reaction to medicines taken.  You have recurrent headaches or feel dizzy.  You have swelling in your ankles.  You have trouble with your vision. SEEK IMMEDIATE MEDICAL CARE IF:  You develop a severe headache or confusion.    You have unusual weakness, numbness, or feel faint.  You have severe chest or abdominal pain.  You vomit repeatedly.  You have trouble breathing. MAKE SURE YOU:   Understand these instructions.  Will watch your condition.  Will get help right away if you are not doing well or get worse. Document  Released: 02/21/2005 Document Revised: 07/08/2013 Document Reviewed: 12/14/2012 [facility] Patient Information 2015 [place], [place]. This information is not intended to replace advice given to you by your health care provider. Make sure you discuss any questions you have with your health care provider. Secondary Amenorrhea  Secondary amenorrhea is the stopping of menstrual flow for 3-6 months in a female who has previously had periods. There are many possible causes. Most of these causes are not serious. Usually, treating the underlying problem causing the loss of menses will return your periods to normal. CAUSES  Some common and uncommon causes of not menstruating include:  Malnutrition.  Low blood sugar (hypoglycemia).  Polycystic ovary disease.  Stress or fear.  Breastfeeding.  Hormone imbalance.  Ovarian failure.  Medicines.  Extreme obesity.  Cystic fibrosis.  Low body weight or drastic weight reduction from any cause.  Early menopause.  Removal of ovaries or uterus.  Contraceptives.  Illness.  Long-term (chronic) illnesses.  Cushing syndrome.  Thyroid problems.  Birth control pills, patches, or vaginal rings for birth control. RISK FACTORS You may be at greater risk of secondary amenorrhea if:  You have a family history of this condition.  You have an eating disorder.  You do athletic training. DIAGNOSIS  A diagnosis is made by your health care provider taking a medical history and doing a physical exam. This will include a pelvic exam to check for problems with your reproductive organs. Pregnancy must be ruled out. Often, numerous blood tests are done to measure different hormones in the body. Urine testing may be done. Specialized exams (ultrasound, CT scan, MRI, or hysteroscopy) may have to be done as well as measuring the body mass index (BMI). TREATMENT  Treatment depends on the cause of the amenorrhea. If an eating disorder is present, this can be  treated with an adequate diet and therapy. Chronic illnesses may improve with treatment of the illness. Amenorrhea may be corrected with medicines, lifestyle changes, or surgery. If the amenorrhea cannot be corrected, it is sometimes possible to create a false menstruation with medicines. HOME CARE INSTRUCTIONS  Maintain a healthy diet.  Manage weight problems.  Exercise regularly but not excessively.  Get adequate sleep.  Manage stress.  Be aware of changes in your menstrual cycle. Keep a record of when your periods occur. Note the date your period starts, how long it lasts, and any problems. SEEK MEDICAL CARE IF: Your symptoms do not get better with treatment. Document Released: 04/04/2006 Document Revised: 10/24/2012 Document Reviewed: 08/09/2012 [facility] Patient Information 2015 [place], [place]. This information is not intended to replace advice given to you by your health care provider. Make sure you discuss any questions you have with your health care provider.

## 2013-11-21 NOTE — Assessment & Plan Note (Signed)
Is pt. Post menopausal or just with oligomenorrhea???  Will check TSH, FSH.  Use provera q o month to ensure menses and prevent endometrial hyperplasia and carcinoma.

## 2013-11-22 ENCOUNTER — Telehealth: Payer: Self-pay

## 2013-11-22 LAB — TSH: TSH: 1.614 u[IU]/mL (ref 0.350–4.500)

## 2013-11-22 LAB — FOLLICLE STIMULATING HORMONE: FSH: 73.5 m[IU]/mL

## 2013-11-22 NOTE — Telephone Encounter (Signed)
Called patient and informed her of results and recommendations. Patient asked if she needs to take BP medication. Informed patient that she does and that she should pick that medication up, however, explained she does not need to take the Provera. Patient verbalized understanding. Explained to patient that the front office staff will call her with an appointment to return to the clinic for possible endometrial biopsy and discuss results. Patient verbalized understanding and gratitude. No further questions or concerns. Message sent to admin pool to schedule patient f/u GYN and call patient with appointment.

## 2013-11-22 NOTE — Telephone Encounter (Signed)
Message copied by Geanie Logan on Fri Nov 22, 2013  8:44 AM ------      Message from: Donnamae Jude      Created: Fri Nov 22, 2013  8:00 AM       Pt. Is menopausal--does not need provera--have patient return for enodmetrial sampling vs. D & C ------

## 2014-01-02 ENCOUNTER — Encounter: Payer: Self-pay | Admitting: Obstetrics & Gynecology

## 2014-01-02 ENCOUNTER — Other Ambulatory Visit (HOSPITAL_COMMUNITY)
Admission: RE | Admit: 2014-01-02 | Discharge: 2014-01-02 | Disposition: A | Discharge status: Home / Self Care | Payer: Self-pay | Source: Ambulatory Visit | Attending: Obstetrics & Gynecology | Admitting: Obstetrics & Gynecology

## 2014-01-02 ENCOUNTER — Ambulatory Visit (INDEPENDENT_AMBULATORY_CARE_PROVIDER_SITE_OTHER): Payer: Self-pay | Admitting: Obstetrics & Gynecology

## 2014-01-02 VITALS — BP 147/91 | HR 75 | Temp 98.3°F | Ht 66.0 in | Wt 265.4 lb

## 2014-01-02 DIAGNOSIS — N950 Postmenopausal bleeding: Secondary | ICD-10-CM | POA: Insufficient documentation

## 2014-01-02 DIAGNOSIS — N938 Other specified abnormal uterine and vaginal bleeding: Secondary | ICD-10-CM

## 2014-01-02 NOTE — Progress Notes (Signed)
Gave patient phone number to make appointment for free pap smear screening. Also gave her number for [facility] for her to make appointmetn for annual exam and follow blood pressure

## 2014-01-02 NOTE — Patient Instructions (Signed)
Menopause Menopause is the normal time of life when menstrual periods stop completely. Menopause is complete when you have missed 12 consecutive menstrual periods. It usually occurs between the ages of 48 years and 55 years. Very rarely does a woman develop menopause before the age of 40 years. At menopause, your ovaries stop producing the female hormones estrogen and progesterone. This can cause undesirable symptoms and also affect your health. Sometimes the symptoms may occur 4-5 years before the menopause begins. There is no relationship between menopause and:  Oral contraceptives.  Number of children you had.  Race.  The age your menstrual periods started (menarche). Heavy smokers and very thin women may develop menopause earlier in life. CAUSES  The ovaries stop producing the female hormones estrogen and progesterone.  Other causes include:  Surgery to remove both ovaries.  The ovaries stop functioning for no known reason.  Tumors of the pituitary gland in the brain.  Medical disease that affects the ovaries and hormone production.  Radiation treatment to the abdomen or pelvis.  Chemotherapy that affects the ovaries. SYMPTOMS   Hot flashes.  Night sweats.  Decrease in sex drive.  Vaginal dryness and thinning of the vagina causing painful intercourse.  Dryness of the skin and developing wrinkles.  Headaches.  Tiredness.  Irritability.  Memory problems.  Weight gain.  Bladder infections.  Hair growth of the face and chest.  Infertility. More serious symptoms include:  Loss of bone (osteoporosis) causing breaks (fractures).  Depression.  Hardening and narrowing of the arteries (atherosclerosis) causing heart attacks and strokes. DIAGNOSIS   When the menstrual periods have stopped for 12 straight months.  Physical exam.  Hormone studies of the blood. TREATMENT  There are many treatment choices and nearly as many questions about them. The  decisions to treat or not to treat menopausal changes is an individual choice made with your health care provider. Your health care provider can discuss the treatments with you. Together, you can decide which treatment will work best for you. Your treatment choices may include:   Hormone therapy (estrogen and progesterone).  Non-hormonal medicines.  Treating the individual symptoms with medicine (for example antidepressants for depression).  Herbal medicines that may help specific symptoms.  Counseling by a psychiatrist or psychologist.  Group therapy.  Lifestyle changes including:  Eating healthy.  Regular exercise.  Limiting caffeine and alcohol.  Stress management and meditation.  No treatment. HOME CARE INSTRUCTIONS   Take the medicine your health care provider gives you as directed.  Get plenty of sleep and rest.  Exercise regularly.  Eat a diet that contains calcium (good for the bones) and soy products (acts like estrogen hormone).  Avoid alcoholic beverages.  Do not smoke.  If you have hot flashes, dress in layers.  Take supplements, calcium, and vitamin D to strengthen bones.  You can use over-the-counter lubricants or moisturizers for vaginal dryness.  Group therapy is sometimes very helpful.  Acupuncture may be helpful in some cases. SEEK MEDICAL CARE IF:   You are not sure you are in menopause.  You are having menopausal symptoms and need advice and treatment.  You are still having menstrual periods after age 55 years.  You have pain with intercourse.  Menopause is complete (no menstrual period for 12 months) and you develop vaginal bleeding.  You need a referral to a specialist (gynecologist, psychiatrist, or psychologist) for treatment. SEEK IMMEDIATE MEDICAL CARE IF:   You have severe depression.  You have excessive vaginal bleeding.    You fell and think you have a broken bone.  You have pain when you urinate.  You develop leg or  chest pain.  You have a fast pounding heart beat (palpitations).  You have severe headaches.  You develop vision problems.  You feel a lump in your breast.  You have abdominal pain or severe indigestion. Document Released: 05/14/2003 Document Revised: 10/24/2012 Document Reviewed: 09/20/2012 [employer] Patient Information 2015 [employer]. This information is not intended to replace advice given to you by your health care provider. Make sure you discuss any questions you have with your health care provider.  

## 2014-01-02 NOTE — Progress Notes (Signed)
Pt unable to provide urine sample for pregnancy test, Dr. Roselie Awkward notified states it is ok to proceed without pregnancy test.

## 2014-01-02 NOTE — Progress Notes (Signed)
Patient ID: Jamie Wilcox, female   DOB: [birth year], 38 y.o.   MRN: [id number] Referred by Dr Kennon Rounds to have endometrial biopsy for PMPB. NO bleeding since End of August. Denies VMS  Patient given informed consent, signed copy in the chart, time out was performed. Appropriate time out taken. . The patient was placed in the lithotomy position and the cervix brought into view with sterile speculum.  Portio of cervix cleansed x 2 with betadine swabs.  A tenaculum was placed in the anterior lip of the cervix.  The uterus was sounded for depth of 9 cm. A pipelle was introduced to into the uterus, suction created,  and an endometrial sample was obtained. All equipment was removed and accounted for.  The patient tolerated the procedure well.  Small amout of specimen obtained  Patient given post procedure instructions. The patient will return in 2 weeks for results or will be called. F/U Prn abnormal bleeding. Likely postmenopausal but still could be perimenopausal  Woodroe Mode, MD  01/02/2014

## 2014-01-06 ENCOUNTER — Encounter: Payer: Self-pay | Admitting: Obstetrics & Gynecology

## 2014-02-21 ENCOUNTER — Encounter: Payer: Self-pay | Admitting: *Deleted

## 2014-06-23 ENCOUNTER — Ambulatory Visit (HOSPITAL_COMMUNITY)
Admission: RE | Admit: 2014-06-23 | Discharge: 2014-06-23 | Disposition: A | Discharge status: Home / Self Care | Payer: Self-pay | Source: Ambulatory Visit | Attending: Cardiology | Admitting: Cardiology

## 2014-06-23 ENCOUNTER — Other Ambulatory Visit: Payer: Self-pay

## 2014-06-23 ENCOUNTER — Encounter: Payer: Self-pay | Admitting: Family Medicine

## 2014-06-23 ENCOUNTER — Ambulatory Visit: Payer: Self-pay | Attending: Family Medicine | Admitting: Family Medicine

## 2014-06-23 VITALS — BP 154/93 | HR 79 | Temp 98.2°F | Resp 18 | Ht 67.0 in | Wt 268.2 lb

## 2014-06-23 DIAGNOSIS — I10 Essential (primary) hypertension: Secondary | ICD-10-CM | POA: Insufficient documentation

## 2014-06-23 DIAGNOSIS — E669 Obesity, unspecified: Secondary | ICD-10-CM | POA: Insufficient documentation

## 2014-06-23 DIAGNOSIS — R079 Chest pain, unspecified: Secondary | ICD-10-CM | POA: Insufficient documentation

## 2014-06-23 DIAGNOSIS — I440 Atrioventricular block, first degree: Secondary | ICD-10-CM | POA: Insufficient documentation

## 2014-06-23 DIAGNOSIS — R0789 Other chest pain: Secondary | ICD-10-CM

## 2014-06-23 DIAGNOSIS — Z6841 Body Mass Index (BMI) 40.0 and over, adult: Secondary | ICD-10-CM | POA: Insufficient documentation

## 2014-06-23 LAB — LIPID PANEL
CHOL/HDL RATIO: 2.7 ratio
CHOLESTEROL: 156 mg/dL (ref 0–200)
HDL: 57 mg/dL (ref >=46)
LDL Cholesterol: 82 mg/dL (ref 0–99)
TRIGLYCERIDES: 85 mg/dL (ref <150)
VLDL: 17 mg/dL (ref 0–40)

## 2014-06-23 LAB — COMPREHENSIVE METABOLIC PANEL
ALBUMIN: 3.9 g/dL (ref 3.5–5.2)
ALT: 21 U/L (ref 0–35)
AST: 21 U/L (ref 0–37)
Alkaline Phosphatase: 78 U/L (ref 39–117)
BILIRUBIN TOTAL: 0.4 mg/dL (ref 0.2–1.2)
BUN: 15 mg/dL (ref 6–23)
CO2: 31 meq/L (ref 19–32)
Calcium: 9.5 mg/dL (ref 8.4–10.5)
Chloride: 102 mEq/L (ref 96–112)
Creat: 0.8 mg/dL (ref 0.50–1.10)
Glucose, Bld: 83 mg/dL (ref 70–99)
POTASSIUM: 4.4 meq/L (ref 3.5–5.3)
SODIUM: 140 meq/L (ref 135–145)
TOTAL PROTEIN: 7.2 g/dL (ref 6.0–8.3)

## 2014-06-23 MED ORDER — LISINOPRIL-HYDROCHLOROTHIAZIDE 20-25 MG PO TABS: 1.0000 | ORAL_TABLET | Freq: Every day | ORAL | Status: DC

## 2014-06-23 NOTE — Progress Notes (Signed)
Patient [place] health [place] on [place]. Patient here for blood pressure check. Patient reports having headache every night, "not too much, but it's pain". Patient reports having chest pain when walking and tired. Patient reports legs swell if she drinks a lot.

## 2014-06-23 NOTE — Patient Instructions (Signed)
Obesity Obesity is defined as having too much total body fat and a body mass index (BMI) of 30 or more. BMI is an estimate of body fat and is calculated from your height and weight. Obesity happens when you consume more calories than you can burn by exercising or performing daily physical tasks. Prolonged obesity can cause major illnesses or emergencies, such as:   Stroke.  Heart disease.  Diabetes.  Cancer.  Arthritis.  High blood pressure (hypertension).  High cholesterol.  Sleep apnea.  Erectile dysfunction.  Infertility problems. CAUSES   Regularly eating unhealthy foods.  Physical inactivity.  Certain disorders, such as an underactive thyroid (hypothyroidism), Cushing's syndrome, and polycystic ovarian syndrome.  Certain medicines, such as steroids, some depression medicines, and antipsychotics.  Genetics.  Lack of sleep. DIAGNOSIS  A health care provider can diagnose obesity after calculating your BMI. Obesity will be diagnosed if your BMI is 30 or higher.  There are other methods of measuring obesity levels. Some other methods include measuring your skinfold thickness, your waist circumference, and comparing your hip circumference to your waist circumference. TREATMENT  A healthy treatment program includes some or all of the following:  Long-term dietary changes.  Exercise and physical activity.  Behavioral and lifestyle changes.  Medicine only under the supervision of your health care provider. Medicines may help, but only if they are used with diet and exercise programs. An unhealthy treatment program includes:  Fasting.  Fad diets.  Supplements and drugs. These choices do not succeed in long-term weight control.  HOME CARE INSTRUCTIONS   Exercise and perform physical activity as directed by your health care provider. To increase physical activity, try the following:  Use stairs instead of elevators.  Park farther away from store  entrances.  Garden, bike, or walk instead of watching television or using the computer.  Eat healthy, low-calorie foods and drinks on a regular basis. Eat more fruits and vegetables. Use low-calorie cookbooks or take healthy cooking classes.  Limit fast food, sweets, and processed snack foods.  Eat smaller portions.  Keep a daily journal of everything you eat. There are many free websites to help you with this. It may be helpful to measure your foods so you can determine if you are eating the correct portion sizes.  Avoid drinking alcohol. Drink more water and drinks without calories.  Take vitamins and supplements only as recommended by your health care provider.  Weight-loss support groups, Tax adviser, counselors, and stress reduction education can also be very helpful. SEEK IMMEDIATE MEDICAL CARE IF:  You have chest pain or tightness.  You have trouble breathing or feel short of breath.  You have weakness or leg numbness.  You feel confused or have trouble talking.  You have sudden changes in your vision. MAKE SURE YOU:  Understand these instructions.  Will watch your condition.  Will get help right away if you are not doing well or get worse. Document Released: 03/31/2004 Document Revised: 07/08/2013 Document Reviewed: 03/30/2011 [facility] Patient Information 2015 [place], [place]. This information is not intended to replace advice given to you by your health care provider. Make sure you discuss any questions you have with your health care provider. Hypertension Hypertension, commonly called high blood pressure, is when the force of blood pumping through your arteries is too strong. Your arteries are the blood vessels that carry blood from your heart throughout your body. A blood pressure reading consists of a higher number over a lower number, such as 110/72. The higher  number (systolic) is the pressure inside your arteries when your heart pumps. The lower number  (diastolic) is the pressure inside your arteries when your heart relaxes. Ideally you want your blood pressure below 120/80. Hypertension forces your heart to work harder to pump blood. Your arteries may become narrow or stiff. Having hypertension puts you at risk for heart disease, stroke, and other problems.  RISK FACTORS Some risk factors for high blood pressure are controllable. Others are not.  Risk factors you cannot control include:   Race. You may be at higher risk if you are African American.  Age. Risk increases with age.  Gender. Men are at higher risk than women before age 53 years. After age 22, women are at higher risk than men. Risk factors you can control include:  Not getting enough exercise or physical activity.  Being overweight.  Getting too much fat, sugar, calories, or salt in your diet.  Drinking too much alcohol. SIGNS AND SYMPTOMS Hypertension does not usually cause signs or symptoms. Extremely high blood pressure (hypertensive crisis) may cause headache, anxiety, shortness of breath, and nosebleed. DIAGNOSIS  To check if you have hypertension, your health care provider will measure your blood pressure while you are seated, with your arm held at the level of your heart. It should be measured at least twice using the same arm. Certain conditions can cause a difference in blood pressure between your right and left arms. A blood pressure reading that is higher than normal on one occasion does not mean that you need treatment. If one blood pressure reading is high, ask your health care provider about having it checked again. TREATMENT  Treating high blood pressure includes making lifestyle changes and possibly taking medicine. Living a healthy lifestyle can help lower high blood pressure. You may need to change some of your habits. Lifestyle changes may include:  Following the DASH diet. This diet is high in fruits, vegetables, and whole grains. It is low in salt, red  meat, and added sugars.  Getting at least 2 hours of brisk physical activity every week.  Losing weight if necessary.  Not smoking.  Limiting alcoholic beverages.  Learning ways to reduce stress. If lifestyle changes are not enough to get your blood pressure under control, your health care provider may prescribe medicine. You may need to take more than one. Work closely with your health care provider to understand the risks and benefits. HOME CARE INSTRUCTIONS  Have your blood pressure rechecked as directed by your health care provider.   Take medicines only as directed by your health care provider. Follow the directions carefully. Blood pressure medicines must be taken as prescribed. The medicine does not work as well when you skip doses. Skipping doses also puts you at risk for problems.   Do not smoke.   Monitor your blood pressure at home as directed by your health care provider. SEEK MEDICAL CARE IF:   You think you are having a reaction to medicines taken.  You have recurrent headaches or feel dizzy.  You have swelling in your ankles.  You have trouble with your vision. SEEK IMMEDIATE MEDICAL CARE IF:  You develop a severe headache or confusion.  You have unusual weakness, numbness, or feel faint.  You have severe chest or abdominal pain.  You vomit repeatedly.  You have trouble breathing. MAKE SURE YOU:   Understand these instructions.  Will watch your condition.  Will get help right away if you are not doing well or  get worse. Document Released: 02/21/2005 Document Revised: 07/08/2013 Document Reviewed: 12/14/2012 [facility] Patient Information 2015 [place], [place]. This information is not intended to replace advice given to you by your health care provider. Make sure you discuss any questions you have with your health care provider.

## 2014-06-23 NOTE — Progress Notes (Signed)
Subjective:    Patient ID: Jamie Wilcox, female    DOB: 11-18-1975, 39 y.o.   MRN: [id number]  HPI  Jamie Wilcox is a new patient here to establish care; she had presented to a  health fair where she was found to have elevated blood pressure and referred to the clinic to establish care. Prior to now she had not been under the care of any physician but endorses taking antihypertensives in the remote past.  She complains of occasional pedal edema which is dependent and sometimes has chest pains related to physical activity but is absent at this time. Last episode occurred yesterday. Does have some headaches but no blurry vision or nausea or vomiting.   Past Medical History  Diagnosis Date  . Infection   . Hypertension     Past Surgical History  Procedure Laterality Date  . Cesarean section    . Tubal ligation      History   Social History  . Marital Status: Single    Spouse Name: N/A  . Number of Children: N/A  . Years of Education: N/A   Occupational History  . Not on file.   Social History Main Topics  . Smoking status: Never Smoker   . Smokeless tobacco: Not on file  . Alcohol Use: No  . Drug Use: No  . Sexual Activity: Not Currently    Birth Control/ Protection: Post-menopausal   Other Topics Concern  . Not on file   Social History Narrative    No Known Allergies  Current Outpatient Prescriptions on File Prior to Visit  Medication Sig Dispense Refill  . medroxyPROGESTERone (PROVERA) 10 MG tablet Take 1 tablet (10 mg total) by mouth daily. Take for 5 day every other month even months on days 1-5 (Patient not taking: Reported on 06/23/2014) 30 tablet 3   No current facility-administered medications on file prior to visit.    Review of Systems  Constitutional: Negative for activity change, appetite change and fatigue.  HENT: Negative for congestion, sinus pressure and sore throat.   Eyes: Negative for visual disturbance.  Respiratory: Negative for cough,  chest tightness, shortness of breath and wheezing.   Cardiovascular: Positive for chest pain and leg swelling. Negative for palpitations.  Gastrointestinal: Negative for abdominal pain, constipation and abdominal distention.  Endocrine: Negative for polydipsia.  Genitourinary: Negative for dysuria and frequency.  Musculoskeletal: Negative for back pain and arthralgias.  Skin: Negative for rash.  Neurological: Negative for tremors, light-headedness and numbness.  Hematological: Does not bruise/bleed easily.  Psychiatric/Behavioral: Negative for behavioral problems and agitation.         Objective: Filed Vitals:   06/23/14 1031  BP: 154/93  Pulse: 79  Temp:   Resp:       Physical Exam  Constitutional: She is oriented to person, place, and time. She appears well-developed and well-nourished. No distress.  Morbidly obese  HENT:  Head: Normocephalic.  Right Ear: External ear normal.  Left Ear: External ear normal.  Nose: Nose normal.  Mouth/Throat: Oropharynx is clear and moist.  Eyes: Conjunctivae and EOM are normal. Pupils are equal, round, and reactive to light.  Neck: Normal range of motion. No JVD present.  Cardiovascular: Normal rate, regular rhythm, normal heart sounds and intact distal pulses.  Exam reveals no gallop.   No murmur heard. Pulmonary/Chest: Effort normal and breath sounds normal. No respiratory distress. She has no wheezes. She has no rales. She exhibits no tenderness.  Abdominal: Soft. Bowel sounds are normal. She  exhibits no distension and no mass. There is no tenderness.  Musculoskeletal: Normal range of motion. She exhibits no edema or tenderness.  Neurological: She is alert and oriented to person, place, and time. She has normal reflexes.  Skin: Skin is warm and dry. She is not diaphoretic.  Psychiatric: She has a normal mood and affect.          Assessment & Plan:  39 year old female patient with uncontrolled hypertension here to establish  care  Hypertension: Uncontrolled. I am placing her on an antihypertensive lisinopril/HCTZ and have discussed the side effects Will send off baseline labs to evaluate renal function and lipids. Blood pressure will be reassessed at her next office visit. Low-sodium diet advised.  Chest pains: Asymptomatic at this time. EKG reveals  normal sinus rhythm with a rate of 72 and first degree AV block She is not on a beta blocker; referred to Dr Verl Blalock for further evaluation. Advised that should she experience similar symptoms she would need to report to the ED.  Obesity: Lifestyle modifications discussed especially cutting back on portion sizes and increasing physical activity as this could result in a significant improvement in her blood pressure.

## 2014-07-16 ENCOUNTER — Ambulatory Visit: Payer: Self-pay | Attending: Cardiology | Admitting: Cardiology

## 2014-07-16 ENCOUNTER — Encounter: Payer: Self-pay | Admitting: Cardiology

## 2014-07-16 VITALS — BP 147/85 | HR 70 | Temp 98.3°F | Resp 18 | Ht 67.0 in | Wt 269.0 lb

## 2014-07-16 DIAGNOSIS — R079 Chest pain, unspecified: Secondary | ICD-10-CM | POA: Insufficient documentation

## 2014-07-16 DIAGNOSIS — I10 Essential (primary) hypertension: Secondary | ICD-10-CM | POA: Insufficient documentation

## 2014-07-16 DIAGNOSIS — I440 Atrioventricular block, first degree: Secondary | ICD-10-CM | POA: Insufficient documentation

## 2014-07-16 MED ORDER — LOSARTAN POTASSIUM 100 MG PO TABS: 100.0000 mg | ORAL_TABLET | Freq: Every day | ORAL | Status: AC

## 2014-07-16 NOTE — Progress Notes (Signed)
Pt comes in to f/u with Dr. Verl Blalock for continuous left chest wall pain with worsening to left arm,swelling in both legs and fatigue Pt was seen 4/18 by PCP Dr. Jarold Song and started on BP medication for elevation States since taking medication, she is feeling very tired BP- 147/85 70 Language barrier noted but able to communicate

## 2014-07-16 NOTE — Patient Instructions (Signed)
Hypertension Hypertension, commonly called high blood pressure, is when the force of blood pumping through your arteries is too strong. Your arteries are the blood vessels that carry blood from your heart throughout your body. A blood pressure reading consists of a higher number over a lower number, such as 110/72. The higher number (systolic) is the pressure inside your arteries when your heart pumps. The lower number (diastolic) is the pressure inside your arteries when your heart relaxes. Ideally you want your blood pressure below 120/80. Hypertension forces your heart to work harder to pump blood. Your arteries may become narrow or stiff. Having hypertension puts you at risk for heart disease, stroke, and other problems.  RISK FACTORS Some risk factors for high blood pressure are controllable. Others are not.  Risk factors you cannot control include:   Race. You may be at higher risk if you are African American.  Age. Risk increases with age.  Gender. Men are at higher risk than women before age 45 years. After age 65, women are at higher risk than men. Risk factors you can control include:  Not getting enough exercise or physical activity.  Being overweight.  Getting too much fat, sugar, calories, or salt in your diet.  Drinking too much alcohol. SIGNS AND SYMPTOMS Hypertension does not usually cause signs or symptoms. Extremely high blood pressure (hypertensive crisis) may cause headache, anxiety, shortness of breath, and nosebleed. DIAGNOSIS  To check if you have hypertension, your health care provider will measure your blood pressure while you are seated, with your arm held at the level of your heart. It should be measured at least twice using the same arm. Certain conditions can cause a difference in blood pressure between your right and left arms. A blood pressure reading that is higher than normal on one occasion does not mean that you need treatment. If one blood pressure reading  is high, ask your health care provider about having it checked again. TREATMENT  Treating high blood pressure includes making lifestyle changes and possibly taking medicine. Living a healthy lifestyle can help lower high blood pressure. You may need to change some of your habits. Lifestyle changes may include:  Following the DASH diet. This diet is high in fruits, vegetables, and whole grains. It is low in salt, red meat, and added sugars.  Getting at least 2 hours of brisk physical activity every week.  Losing weight if necessary.  Not smoking.  Limiting alcoholic beverages.  Learning ways to reduce stress. If lifestyle changes are not enough to get your blood pressure under control, your health care provider may prescribe medicine. You may need to take more than one. Work closely with your health care provider to understand the risks and benefits. HOME CARE INSTRUCTIONS  Have your blood pressure rechecked as directed by your health care provider.   Take medicines only as directed by your health care provider. Follow the directions carefully. Blood pressure medicines must be taken as prescribed. The medicine does not work as well when you skip doses. Skipping doses also puts you at risk for problems.   Do not smoke.   Monitor your blood pressure at home as directed by your health care provider. SEEK MEDICAL CARE IF:   You think you are having a reaction to medicines taken.  You have recurrent headaches or feel dizzy.  You have swelling in your ankles.  You have trouble with your vision. SEEK IMMEDIATE MEDICAL CARE IF:  You develop a severe headache or confusion.    You have unusual weakness, numbness, or feel faint.  You have severe chest or abdominal pain.  You vomit repeatedly.  You have trouble breathing. MAKE SURE YOU:   Understand these instructions.  Will watch your condition.  Will get help right away if you are not doing well or get worse. Document  Released: 02/21/2005 Document Revised: 07/08/2013 Document Reviewed: 12/14/2012 [employer] Patient Information 2015 [employer]. This information is not intended to replace advice given to you by your health care provider. Make sure you discuss any questions you have with your health care provider. DASH Eating Plan DASH stands for "Dietary Approaches to Stop Hypertension." The DASH eating plan is a healthy eating plan that has been shown to reduce high blood pressure (hypertension). Additional health benefits may include reducing the risk of type 2 diabetes mellitus, heart disease, and stroke. The DASH eating plan may also help with weight loss. WHAT DO I NEED TO KNOW ABOUT THE DASH EATING PLAN? For the DASH eating plan, you will follow these general guidelines:  Choose foods with a percent daily value for sodium of less than 5% (as listed on the food label).  Use salt-free seasonings or herbs instead of table salt or sea salt.  Check with your health care provider or pharmacist before using salt substitutes.  Eat lower-sodium products, often labeled as "lower sodium" or "no salt added."  Eat fresh foods.  Eat more vegetables, fruits, and low-fat dairy products.  Choose whole grains. Look for the word "whole" as the first word in the ingredient list.  Choose fish and skinless chicken or turkey more often than red meat. Limit fish, poultry, and meat to 6 oz (170 g) each day.  Limit sweets, desserts, sugars, and sugary drinks.  Choose heart-healthy fats.  Limit cheese to 1 oz (28 g) per day.  Eat more home-cooked food and less restaurant, buffet, and fast food.  Limit fried foods.  Cook foods using methods other than frying.  Limit canned vegetables. If you do use them, rinse them well to decrease the sodium.  When eating at a restaurant, ask that your food be prepared with less salt, or no salt if possible. WHAT FOODS CAN I EAT? Seek help from a dietitian for individual  calorie needs. Grains Whole grain or whole wheat bread. Brown rice. Whole grain or whole wheat pasta. Quinoa, bulgur, and whole grain cereals. Low-sodium cereals. Corn or whole wheat flour tortillas. Whole grain cornbread. Whole grain crackers. Low-sodium crackers. Vegetables Fresh or frozen vegetables (raw, steamed, roasted, or grilled). Low-sodium or reduced-sodium tomato and vegetable juices. Low-sodium or reduced-sodium tomato sauce and paste. Low-sodium or reduced-sodium canned vegetables.  Fruits All fresh, canned (in natural juice), or frozen fruits. Meat and Other Protein Products Ground beef (85% or leaner), grass-fed beef, or beef trimmed of fat. Skinless chicken or turkey. Ground chicken or turkey. Pork trimmed of fat. All fish and seafood. Eggs. Dried beans, peas, or lentils. Unsalted nuts and seeds. Unsalted canned beans. Dairy Low-fat dairy products, such as skim or 1% milk, 2% or reduced-fat cheeses, low-fat ricotta or cottage cheese, or plain low-fat yogurt. Low-sodium or reduced-sodium cheeses. Fats and Oils Tub margarines without trans fats. Light or reduced-fat mayonnaise and salad dressings (reduced sodium). Avocado. Safflower, olive, or canola oils. Natural peanut or almond butter. Other Unsalted popcorn and pretzels. The items listed above may not be a complete list of recommended foods or beverages. Contact your dietitian for more options. WHAT FOODS ARE NOT RECOMMENDED? Grains White bread.   White pasta. White rice. Refined cornbread. Bagels and croissants. Crackers that contain trans fat. Vegetables Creamed or fried vegetables. Vegetables in a cheese sauce. Regular canned vegetables. Regular canned tomato sauce and paste. Regular tomato and vegetable juices. Fruits Dried fruits. Canned fruit in light or heavy syrup. Fruit juice. Meat and Other Protein Products Fatty cuts of meat. Ribs, chicken wings, bacon, sausage, bologna, salami, chitterlings, fatback, hot dogs,  bratwurst, and packaged luncheon meats. Salted nuts and seeds. Canned beans with salt. Dairy Whole or 2% milk, cream, half-and-half, and cream cheese. Whole-fat or sweetened yogurt. Full-fat cheeses or blue cheese. Nondairy creamers and whipped toppings. Processed cheese, cheese spreads, or cheese curds. Condiments Onion and garlic salt, seasoned salt, table salt, and sea salt. Canned and packaged gravies. Worcestershire sauce. Tartar sauce. Barbecue sauce. Teriyaki sauce. Soy sauce, including reduced sodium. Steak sauce. Fish sauce. Oyster sauce. Cocktail sauce. Horseradish. Ketchup and mustard. Meat flavorings and tenderizers. Bouillon cubes. Hot sauce. Tabasco sauce. Marinades. Taco seasonings. Relishes. Fats and Oils Butter, stick margarine, lard, shortening, ghee, and bacon fat. Coconut, palm kernel, or palm oils. Regular salad dressings. Other Pickles and olives. Salted popcorn and pretzels. The items listed above may not be a complete list of foods and beverages to avoid. Contact your dietitian for more information. WHERE CAN I FIND MORE INFORMATION? National Heart, Lung, and [employer] Institute: www.nhlbi.nih.gov/health/health-topics/topics/dash/ Document Released: 02/10/2011 Document Revised: 07/08/2013 Document Reviewed: 12/26/2012 [employer] Patient Information 2015 [employer]. This information is not intended to replace advice given to you by your health care provider. Make sure you discuss any questions you have with your health care provider.  

## 2014-07-16 NOTE — Assessment & Plan Note (Signed)
This is chronic and noncardiac. No further cardiac evaluation. Patient reassured. I have reinforced the need for controlling her blood pressure, avoiding salt, and reducing her weight. She does not like her blood pressure medicine she was started on. She says it does not allow her to PT more than once a day. Her urine is yellow. I've asked her to drink more free water. We will change her to losartan 100 mg a day with blood pressure check in a week.

## 2014-07-16 NOTE — Progress Notes (Signed)
Pt given strict instructions to watch sodium intake with taking new prescribed medication Medication e-scribed to [place] given 2 week nurse visit for BP recheck

## 2014-07-16 NOTE — Progress Notes (Signed)
HPI Jamie Wilcox is a 39 year old woman referred by [facility] for chest pain. It is chronic and has been going on for years. It is generally on the left lateral chest [name] and back. It is not associated with any activity. It is not affected by position. It is not made worse by breathing. EKG during the evaluation of chest pain was normal except for first-degree AV block.  Her risk factors include morbid obesity and hypertension for cardiovascular disease. She is not diabetic yet. Her lipids are remarkably good considering her body habitus.  She has had some edema this dependent. She denies orthopnea or PND.  Past Medical History  Diagnosis Date  . Infection   . Hypertension     Current Outpatient Prescriptions  Medication Sig Dispense Refill  . lisinopril-hydrochlorothiazide (PRINZIDE,ZESTORETIC) 20-25 MG per tablet Take 1 tablet by mouth daily. 90 tablet 3  . medroxyPROGESTERone (PROVERA) 10 MG tablet Take 1 tablet (10 mg total) by mouth daily. Take for 5 day every other month even months on days 1-5 (Patient not taking: Reported on 06/23/2014) 30 tablet 3   No current facility-administered medications for this visit.    No Known Allergies  History reviewed. No pertinent family history.  History   Social History  . Marital Status: Single    Spouse Name: N/A  . Number of Children: N/A  . Years of Education: N/A   Occupational History  . Not on file.   Social History Main Topics  . Smoking status: Never Smoker   . Smokeless tobacco: Not on file  . Alcohol Use: No  . Drug Use: No  . Sexual Activity: Not Currently    Birth Control/ Protection: Post-menopausal   Other Topics Concern  . Not on file   Social History Narrative    ROS ALL NEGATIVE EXCEPT THOSE NOTED IN HPI  PE  General Appearance: well developed, well nourished in no acute distress, morbidly obese HEENT: symmetrical face, PERRLA, good dentition  Neck: no JVD, thyromegaly, or adenopathy, trachea  midline Chest: symmetric without deformity Cardiac: PMI  RRR, normal S1, S2, no gallop or murmur Lung: clear to ausculation  Vascular: all pulses full without bruits Extremities: no cyanosis, clubbing or edema, no sign of DVT, no varicosities  Skin: normal color, no rashes Neuro: alert and oriented x 3, non-focal Pysch: normal affect  EKG Reviewed but not repeated. BMET    Component Value Date/Time   NA 140 06/23/2014 1049   K 4.4 06/23/2014 1049   CL 102 06/23/2014 1049   CO2 31 06/23/2014 1049   GLUCOSE 83 06/23/2014 1049   BUN 15 06/23/2014 1049   CREATININE 0.80 06/23/2014 1049   CREATININE 0.89 05/17/2012 1204   CALCIUM 9.5 06/23/2014 1049   GFRNONAA 82* 05/17/2012 1204   GFRAA >90 05/17/2012 1204    Lipid Panel     Component Value Date/Time   CHOL 156 06/23/2014 1049   TRIG 85 06/23/2014 1049   HDL 57 06/23/2014 1049   CHOLHDL 2.7 06/23/2014 1049   VLDL 17 06/23/2014 1049   LDLCALC 82 06/23/2014 1049    CBC    Component Value Date/Time   WBC 4.4 10/28/2013 1120   RBC 4.61 10/28/2013 1120   HGB 12.3 10/28/2013 1120   HCT 37.8 10/28/2013 1120   PLT 167 10/28/2013 1120   MCV 82.0 10/28/2013 1120   MCH 26.7 10/28/2013 1120   MCHC 32.5 10/28/2013 1120   RDW 14.3 10/28/2013 1120

## 2014-07-17 ENCOUNTER — Ambulatory Visit: Payer: Self-pay | Attending: Internal Medicine

## 2014-07-25 ENCOUNTER — Ambulatory Visit: Payer: Self-pay | Attending: Family Medicine | Admitting: *Deleted

## 2014-07-25 VITALS — BP 132/77 | HR 73 | Temp 98.1°F | Resp 20

## 2014-07-25 DIAGNOSIS — I10 Essential (primary) hypertension: Secondary | ICD-10-CM

## 2014-07-25 NOTE — Patient Instructions (Signed)
DASH Eating Plan °DASH stands for "Dietary Approaches to Stop Hypertension." The DASH eating plan is a healthy eating plan that has been shown to reduce high blood pressure (hypertension). Additional health benefits may include reducing the risk of type 2 diabetes mellitus, heart disease, and stroke. The DASH eating plan may also help with weight loss. °WHAT DO I NEED TO KNOW ABOUT THE DASH EATING PLAN? °For the DASH eating plan, you will follow these general guidelines: °· Choose foods with a percent daily value for sodium of less than 5% (as listed on the food label). °· Use salt-free seasonings or herbs instead of table salt or sea salt. °· Check with your health care provider or pharmacist before using salt substitutes. °· Eat lower-sodium products, often labeled as "lower sodium" or "no salt added." °· Eat fresh foods. °· Eat more vegetables, fruits, and low-fat dairy products. °· Choose whole grains. Look for the word "whole" as the first word in the ingredient list. °· Choose fish and skinless chicken or turkey more often than red meat. Limit fish, poultry, and meat to 6 oz (170 g) each day. °· Limit sweets, desserts, sugars, and sugary drinks. °· Choose heart-healthy fats. °· Limit cheese to 1 oz (28 g) per day. °· Eat more home-cooked food and less restaurant, buffet, and fast food. °· Limit fried foods. °· Cook foods using methods other than frying. °· Limit canned vegetables. If you do use them, rinse them well to decrease the sodium. °· When eating at a restaurant, ask that your food be prepared with less salt, or no salt if possible. °WHAT FOODS CAN I EAT? °Seek help from a dietitian for individual calorie needs. °Grains °Whole grain or whole wheat bread. Brown rice. Whole grain or whole wheat pasta. Quinoa, bulgur, and whole grain cereals. Low-sodium cereals. Corn or whole wheat flour tortillas. Whole grain cornbread. Whole grain crackers. Low-sodium crackers. °Vegetables °Fresh or frozen vegetables  (raw, steamed, roasted, or grilled). Low-sodium or reduced-sodium tomato and vegetable juices. Low-sodium or reduced-sodium tomato sauce and paste. Low-sodium or reduced-sodium canned vegetables.  °Fruits °All fresh, canned (in natural juice), or frozen fruits. °Meat and Other Protein Products °Ground beef (85% or leaner), grass-fed beef, or beef trimmed of fat. Skinless chicken or turkey. Ground chicken or turkey. Pork trimmed of fat. All fish and seafood. Eggs. Dried beans, peas, or lentils. Unsalted nuts and seeds. Unsalted canned beans. °Dairy °Low-fat dairy products, such as skim or 1% milk, 2% or reduced-fat cheeses, low-fat ricotta or cottage cheese, or plain low-fat yogurt. Low-sodium or reduced-sodium cheeses. °Fats and Oils °Tub margarines without trans fats. Light or reduced-fat mayonnaise and salad dressings (reduced sodium). Avocado. Safflower, olive, or canola oils. Natural peanut or almond butter. °Other °Unsalted popcorn and pretzels. °The items listed above may not be a complete list of recommended foods or beverages. Contact your dietitian for more options. °WHAT FOODS ARE NOT RECOMMENDED? °Grains °White bread. White pasta. White rice. Refined cornbread. Bagels and croissants. Crackers that contain trans fat. °Vegetables °Creamed or fried vegetables. Vegetables in a cheese sauce. Regular canned vegetables. Regular canned tomato sauce and paste. Regular tomato and vegetable juices. °Fruits °Dried fruits. Canned fruit in light or heavy syrup. Fruit juice. °Meat and Other Protein Products °Fatty cuts of meat. Ribs, chicken wings, bacon, sausage, bologna, salami, chitterlings, fatback, hot dogs, bratwurst, and packaged luncheon meats. Salted nuts and seeds. Canned beans with salt. °Dairy °Whole or 2% milk, cream, half-and-half, and cream cheese. Whole-fat or sweetened yogurt. Full-fat   cheeses or blue cheese. Nondairy creamers and whipped toppings. Processed cheese, cheese spreads, or cheese  curds. °Condiments °Onion and garlic salt, seasoned salt, table salt, and sea salt. Canned and packaged gravies. Worcestershire sauce. Tartar sauce. Barbecue sauce. Teriyaki sauce. Soy sauce, including reduced sodium. Steak sauce. Fish sauce. Oyster sauce. Cocktail sauce. Horseradish. Ketchup and mustard. Meat flavorings and tenderizers. Bouillon cubes. Hot sauce. Tabasco sauce. Marinades. Taco seasonings. Relishes. °Fats and Oils °Butter, stick margarine, lard, shortening, ghee, and bacon fat. Coconut, palm kernel, or palm oils. Regular salad dressings. °Other °Pickles and olives. Salted popcorn and pretzels. °The items listed above may not be a complete list of foods and beverages to avoid. Contact your dietitian for more information. °WHERE CAN I FIND MORE INFORMATION? °National Heart, Lung, and Blood Institute: www.nhlbi.nih.gov/health/health-topics/topics/dash/ °Document Released: 02/10/2011 Document Revised: 07/08/2013 Document Reviewed: 12/26/2012 °ExitCare® Patient Information ©2015 [employer]. This information is not intended to replace advice given to you by your health care provider. Make sure you discuss any questions you have with your health care provider. ° °

## 2014-07-25 NOTE — Progress Notes (Signed)
Patient presents for BP check after starting losartan 100 mg daily Med list reviewed; losartan is only med patient is taking Patient has decreased salt use but continues to use. Discussed need for low sodium diet and using Mrs. Dash as alternative to salt. Encouraged to choose foods with 5% or less of daily value for sodium. Walking 45 minutes per day for exercise Patient denies blurred vision Denies chest pain  Positive for intermittent headaches; none at present. Positive for [facility] when walking   Filed Vitals:   07/25/14 1205  BP: 132/77  Pulse: 73  Temp: 98.1 F (36.7 C)  Resp: 20     Patient advised to call for med refills at least 7 days before running out so as not to go without.  Patient aware to schedule appt to establish care with PCP   Patient given literature on [place]

## 2014-08-14 ENCOUNTER — Ambulatory Visit: Payer: Self-pay | Attending: Internal Medicine

## 2014-08-22 ENCOUNTER — Ambulatory Visit: Payer: Self-pay | Attending: Internal Medicine | Admitting: Internal Medicine

## 2014-08-22 ENCOUNTER — Encounter: Payer: Self-pay | Admitting: Internal Medicine

## 2014-08-22 VITALS — BP 132/84 | HR 72 | Temp 98.1°F | Resp 20 | Ht 66.0 in | Wt 258.2 lb

## 2014-08-22 DIAGNOSIS — I10 Essential (primary) hypertension: Secondary | ICD-10-CM | POA: Insufficient documentation

## 2014-08-22 NOTE — Patient Instructions (Addendum)
Call [phone] to see if they can help you get the results of your cervical cancer screening    DASH Eating Plan DASH stands for "Dietary Approaches to Stop Hypertension." The DASH eating plan is a healthy eating plan that has been shown to reduce high blood pressure (hypertension). Additional health benefits may include reducing the risk of type 2 diabetes mellitus, heart disease, and stroke. The DASH eating plan may also help with weight loss. WHAT DO I NEED TO KNOW ABOUT THE DASH EATING PLAN? For the DASH eating plan, you will follow these general guidelines:  Choose foods with a percent daily value for sodium of less than 5% (as listed on the food label).  Use salt-free seasonings or herbs instead of table salt or sea salt.  Check with your health care provider or pharmacist before using salt substitutes.  Eat lower-sodium products, often labeled as "lower sodium" or "no salt added."  Eat fresh foods.  Eat more vegetables, fruits, and low-fat dairy products.  Choose whole grains. Look for the word "whole" as the first word in the ingredient list.  Choose fish and skinless chicken or [place] more often than red meat. Limit fish, poultry, and meat to 6 oz (170 g) each day.  Limit sweets, desserts, sugars, and sugary drinks.  Choose heart-healthy fats.  Limit cheese to 1 oz (28 g) per day.  Eat more home-cooked food and less restaurant, buffet, and fast food.  Limit fried foods.  Cook foods using methods other than frying.  Limit canned vegetables. If you do use them, rinse them well to decrease the sodium.  When eating at a restaurant, ask that your food be prepared with less salt, or no salt if possible. WHAT FOODS CAN I EAT? Seek help from a dietitian for individual calorie needs. Grains Whole grain or whole wheat bread. Brown rice. Whole grain or whole wheat pasta. Quinoa, bulgur, and whole grain cereals. Low-sodium cereals. Corn or whole wheat flour tortillas. Whole  grain cornbread. Whole grain crackers. Low-sodium crackers. Vegetables Fresh or frozen vegetables (raw, steamed, roasted, or grilled). Low-sodium or reduced-sodium tomato and vegetable juices. Low-sodium or reduced-sodium tomato sauce and paste. Low-sodium or reduced-sodium canned vegetables.  Fruits All fresh, canned (in natural juice), or frozen fruits. Meat and Other Protein Products Ground beef (85% or leaner), grass-fed beef, or beef trimmed of fat. Skinless chicken or [place]. Ground chicken or [place]. Pork trimmed of fat. All fish and seafood. Eggs. Dried beans, peas, or lentils. Unsalted nuts and seeds. Unsalted canned beans. Dairy Low-fat dairy products, such as skim or 1% milk, 2% or reduced-fat cheeses, low-fat ricotta or cottage cheese, or plain low-fat yogurt. Low-sodium or reduced-sodium cheeses. Fats and Oils Tub margarines without trans fats. Light or reduced-fat mayonnaise and salad dressings (reduced sodium). Avocado. Safflower, olive, or canola oils. Natural peanut or almond butter. Other Unsalted popcorn and pretzels. The items listed above may not be a complete list of recommended foods or beverages. Contact your dietitian for more options. WHAT FOODS ARE NOT RECOMMENDED? Grains White bread. White pasta. White rice. Refined cornbread. Bagels and croissants. Crackers that contain trans fat. Vegetables Creamed or fried vegetables. Vegetables in a cheese sauce. Regular canned vegetables. Regular canned tomato sauce and paste. Regular tomato and vegetable juices. Fruits Dried fruits. Canned fruit in light or heavy syrup. Fruit juice. Meat and Other Protein Products Fatty cuts of meat. Ribs, chicken wings, bacon, sausage, bologna, salami, chitterlings, fatback, hot dogs, bratwurst, and packaged luncheon meats. Salted nuts and  seeds. Canned beans with salt. Dairy Whole or 2% milk, cream, half-and-half, and cream cheese. Whole-fat or sweetened yogurt. Full-fat cheeses or blue  cheese. Nondairy creamers and whipped toppings. Processed cheese, cheese spreads, or cheese curds. Condiments Onion and garlic salt, seasoned salt, table salt, and sea salt. Canned and packaged gravies. Worcestershire sauce. Tartar sauce. Barbecue sauce. Teriyaki sauce. Soy sauce, including reduced sodium. Steak sauce. Fish sauce. Oyster sauce. Cocktail sauce. Horseradish. Ketchup and mustard. Meat flavorings and tenderizers. Bouillon cubes. Hot sauce. Tabasco sauce. Marinades. Taco seasonings. Relishes. Fats and Oils Butter, stick margarine, lard, shortening, ghee, and bacon fat. Coconut, palm kernel, or palm oils. Regular salad dressings. Other Pickles and olives. Salted popcorn and pretzels. The items listed above may not be a complete list of foods and beverages to avoid. Contact your dietitian for more information. WHERE CAN I FIND MORE INFORMATION? National Heart, Lung, and Blood Institute: travelstabloid.com Document Released: 02/10/2011 Document Revised: 07/08/2013 Document Reviewed: 12/26/2012 [facility] Patient Information 2015 [place], [place]. This information is not intended to replace advice given to you by your health care provider. Make sure you discuss any questions you have with your health care provider.

## 2014-08-22 NOTE — Progress Notes (Signed)
Patient ID: Jamie Wilcox, female   DOB: April 17, 1975, 39 y.o.   MRN: [id number] CC: HTN f/u   HPI: Jamie Wilcox is a 39 y.o. female here today for a follow up visit.  Patient has past medical history of hypertension and obesity.  Patient was previously seen here by Dr. Jarold Song after attending a health fair and was started on Lisinopril. After being referred to Cardiology for chest pain she was then changed to Losartan 100 mg daily. Today patient presents to establish care with a PCP. Had some swelling in BLE but noticed it went away after decreasing salt consumption. She has no other concerns today.  Last pap-- was last year at [facility] cone free cervical cancer screening    Patient has No headache, No chest pain, No abdominal pain - No Nausea, No new weakness tingling or numbness, No Cough - SOB.  No Known Allergies Past Medical History  Diagnosis Date  . Infection   . Hypertension    Current Outpatient Prescriptions on File Prior to Visit  Medication Sig Dispense Refill  . losartan (COZAAR) 100 MG tablet Take 1 tablet (100 mg total) by mouth daily. 90 tablet 3   No current facility-administered medications on file prior to visit.   History reviewed. No pertinent family history. History   Social History  . Marital Status: Single    Spouse Name: N/A  . Number of Children: N/A  . Years of Education: N/A   Occupational History  . Not on file.   Social History Main Topics  . Smoking status: Never Smoker   . Smokeless tobacco: Not on file  . Alcohol Use: No  . Drug Use: No  . Sexual Activity: Not Currently    Birth Control/ Protection: Post-menopausal   Other Topics Concern  . Not on file   Social History Narrative    Review of Systems: See HPI.    Objective:   Filed Vitals:   08/22/14 0924  BP: 132/84  Pulse: 72  Temp: 98.1 F (36.7 C)  Resp: 20    Physical Exam  Constitutional: She is oriented to person, place, and time.  Cardiovascular: Normal rate, regular  rhythm and normal heart sounds.   Pulmonary/Chest: Effort normal and breath sounds normal.  Musculoskeletal: She exhibits edema (trace).  Neurological: She is alert and oriented to person, place, and time.  Skin: Skin is warm and dry.     Lab Results  Component Value Date   WBC 4.4 10/28/2013   HGB 12.3 10/28/2013   HCT 37.8 10/28/2013   MCV 82.0 10/28/2013   PLT 167 10/28/2013   Lab Results  Component Value Date   CREATININE 0.80 06/23/2014   BUN 15 06/23/2014   NA 140 06/23/2014   K 4.4 06/23/2014   CL 102 06/23/2014   CO2 31 06/23/2014    No results found for: HGBA1C Lipid Panel     Component Value Date/Time   CHOL 156 06/23/2014 1049   TRIG 85 06/23/2014 1049   HDL 57 06/23/2014 1049   CHOLHDL 2.7 06/23/2014 1049   VLDL 17 06/23/2014 1049   LDLCALC 82 06/23/2014 1049       Assessment and plan:   Jamie Wilcox was seen today for blood pressure check and establish care.  Diagnoses and all orders for this visit:  Essential hypertension Continue Losartan. Dash diet advised. Weight loss advised.   Return in about 3 months (around 11/22/2014) for Hypertension.       Chari Manning, NP-C [facility] [phone] 08/22/2014, 9:36 AM

## 2014-08-22 NOTE — Progress Notes (Signed)
Patient presents to establish care for hypertension Patient feels well today Never smoker  Filed Vitals:   08/22/14 0924  BP: 132/84  Pulse: 72  Temp: 98.1 F (36.7 C)  Resp: 20

## 2014-08-25 IMAGING — US US PELVIS COMPLETE
1 series · 13 of 25 positions shown · non-contrast
Comparison: None

CLINICAL DATA: Dysfunctional uterine bleeding.



[Series 1: us pelvis complete · 13 of 57 slices shown]
[im 1/57]
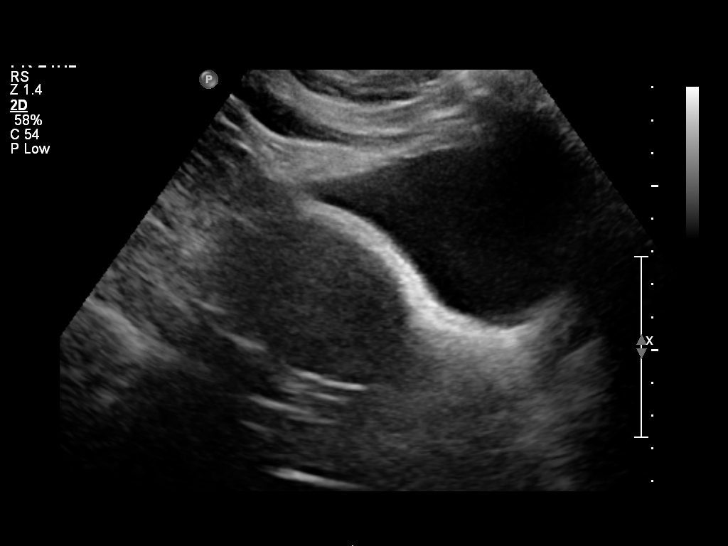
[im 5/57]
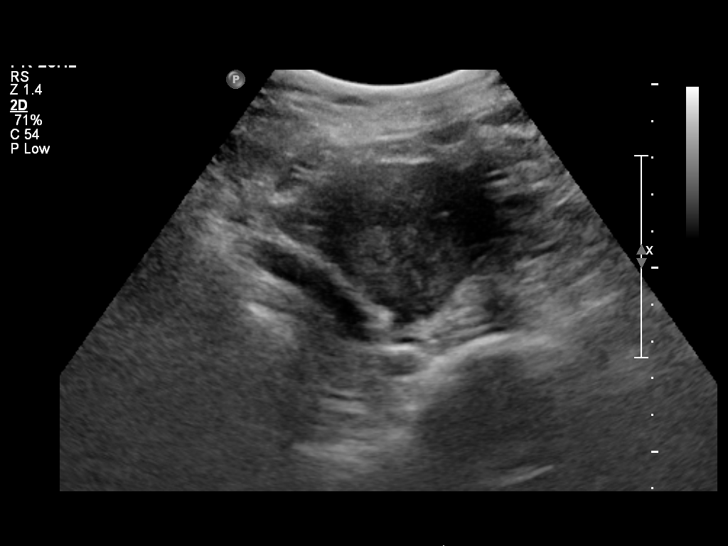
[im 10/57]
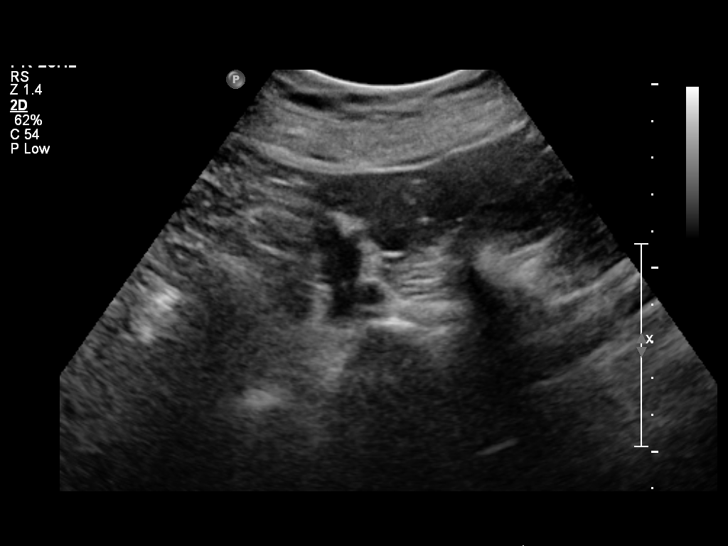
[im 15/57]
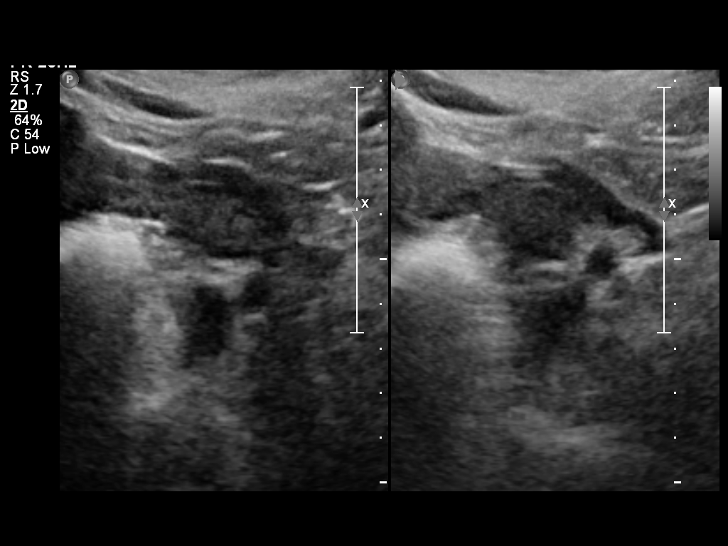
[im 19/57]
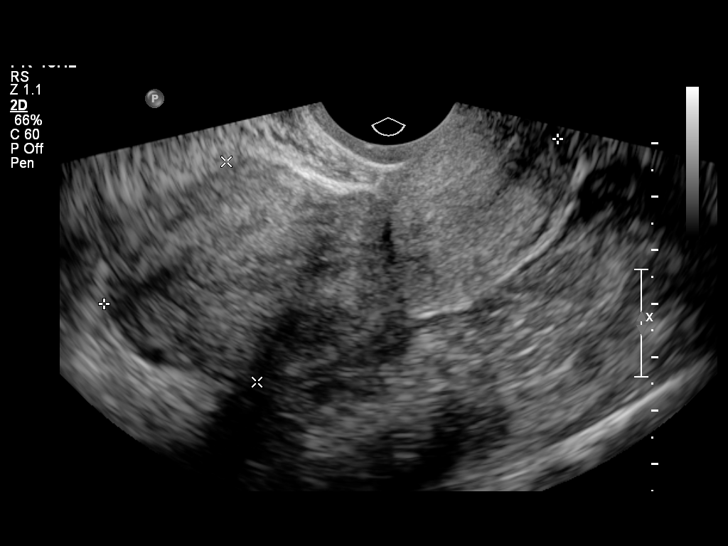
[im 24/57]
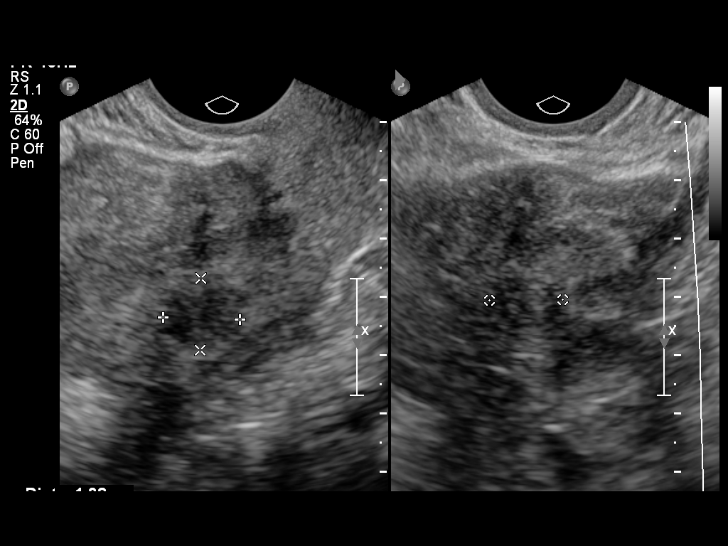
[im 29/57]
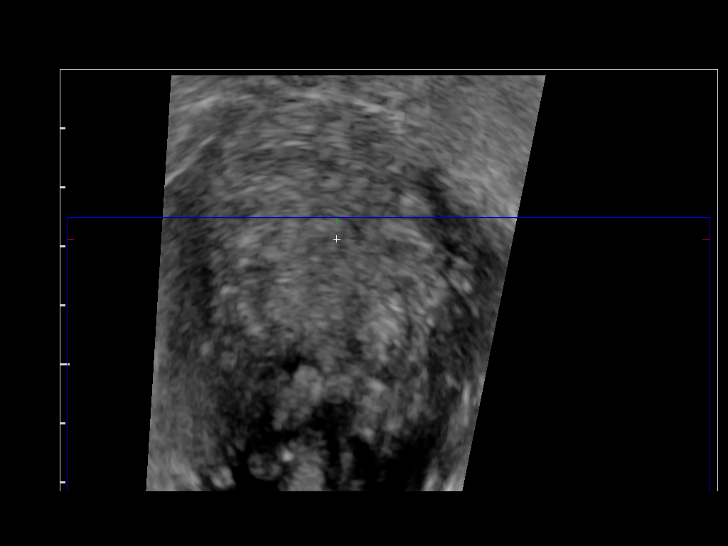
[im 33/57]
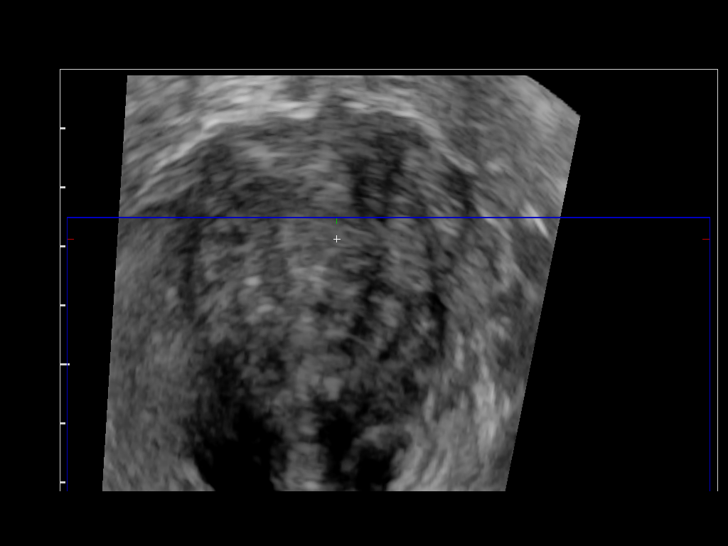
[im 38/57]
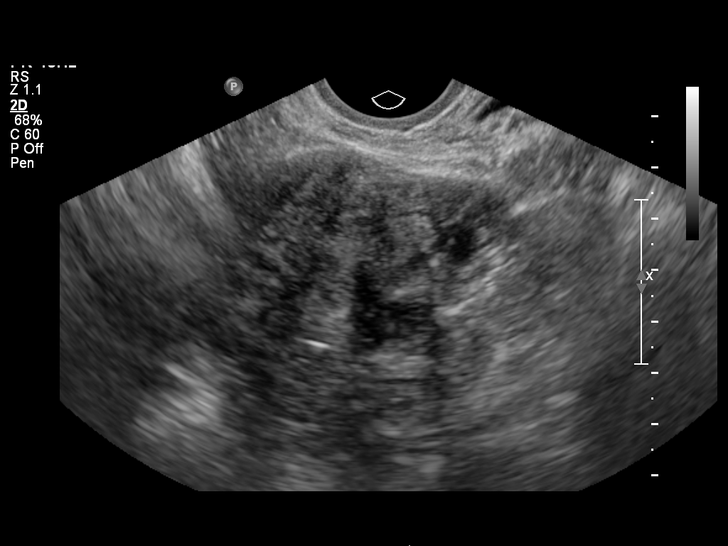
[im 43/57]
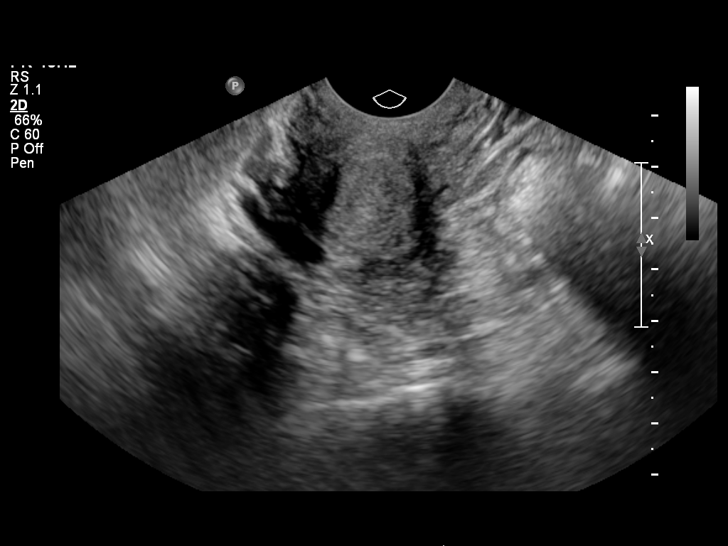
[im 47/57]
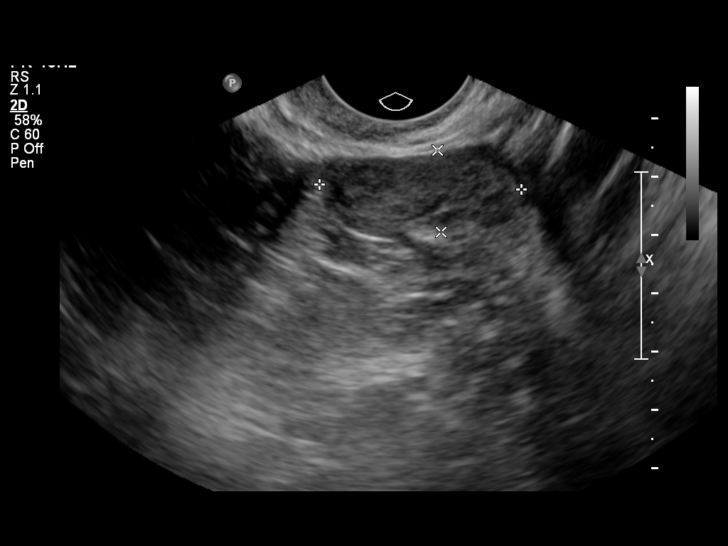
[im 52/57]
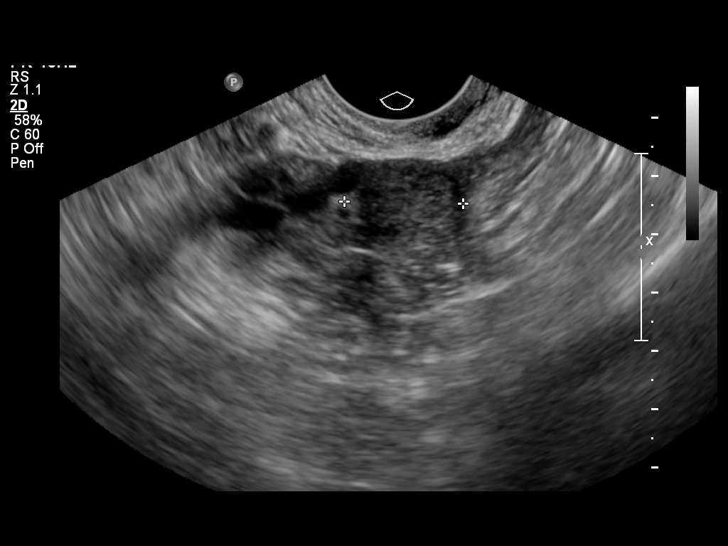
[im 57/57]
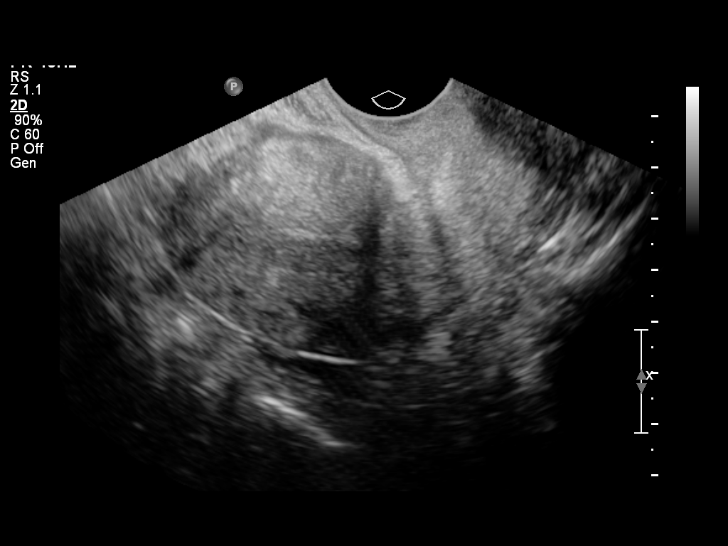

[13 of 25 positions shown; findings below may reference images not displayed]

FINDINGS: Uterus: The uterus measures 9.0 x 4.2 x 5.6 cm.  The myometrial
echotexture is diffusely heterogeneous.  There is a focal rounded
hypoechoic mass in the left aspect of the lower uterine segment
measuring 1.7 x 1.0 x 1.5 cm, consistent with an intramural uterine
fibroid.  In the right posterior uterine body is a second
intramural fibroid measuring 1.3 x 1.2 x 1.3 cm. The junctional
zone between the endometrium and myometrium is indistinct.

Endometrium: Normal in thickness and appearance.  Measures 3 mm.

Right ovary:  Normal appearance/no adnexal mass.  Measures 3.1 x
1.6 x 1.4 cm.

Left ovary: Normal appearance/no adnexal mass.  Measures 3.5 x
x 2.0 cm.

Other findings: No free fluid
IMPRESSION: 1.  The uterus is diffusely heterogeneous, for which underlying
adenomyosis cannot be excluded.
2.  Two small focal intramural uterine fibroids.
3.  Normal endometrial thickness and normal ovaries.

## 2014-10-12 IMAGING — CR DG THORACIC SPINE 2V
3 series · 3 of 3 positions shown · non-contrast
Comparison: None.

CLINICAL DATA: 1-month history of interscapular back pain.  No
known injuries.

THORACIC SPINE - 2 VIEW

[w t-spine a.p. *]
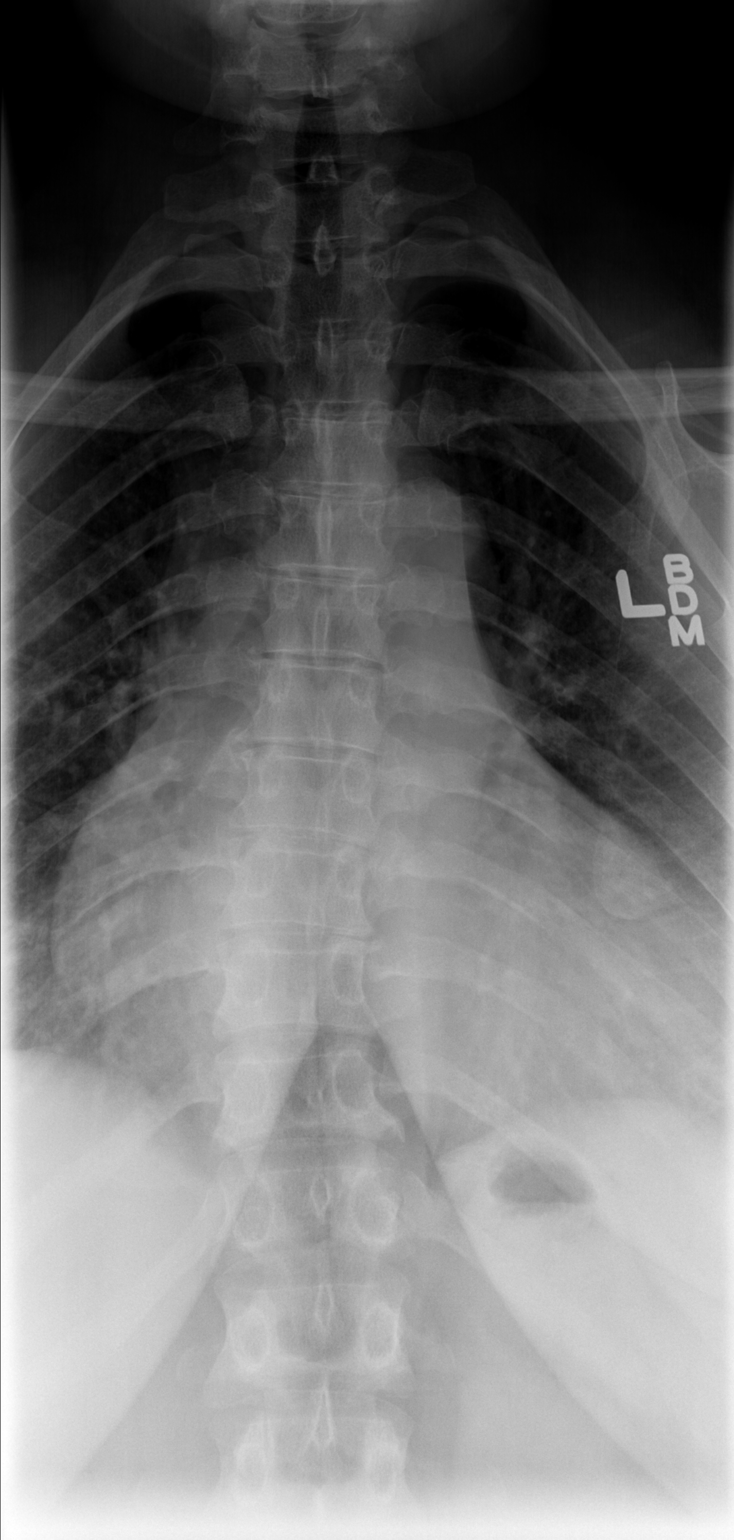

[w t-spine lat *]
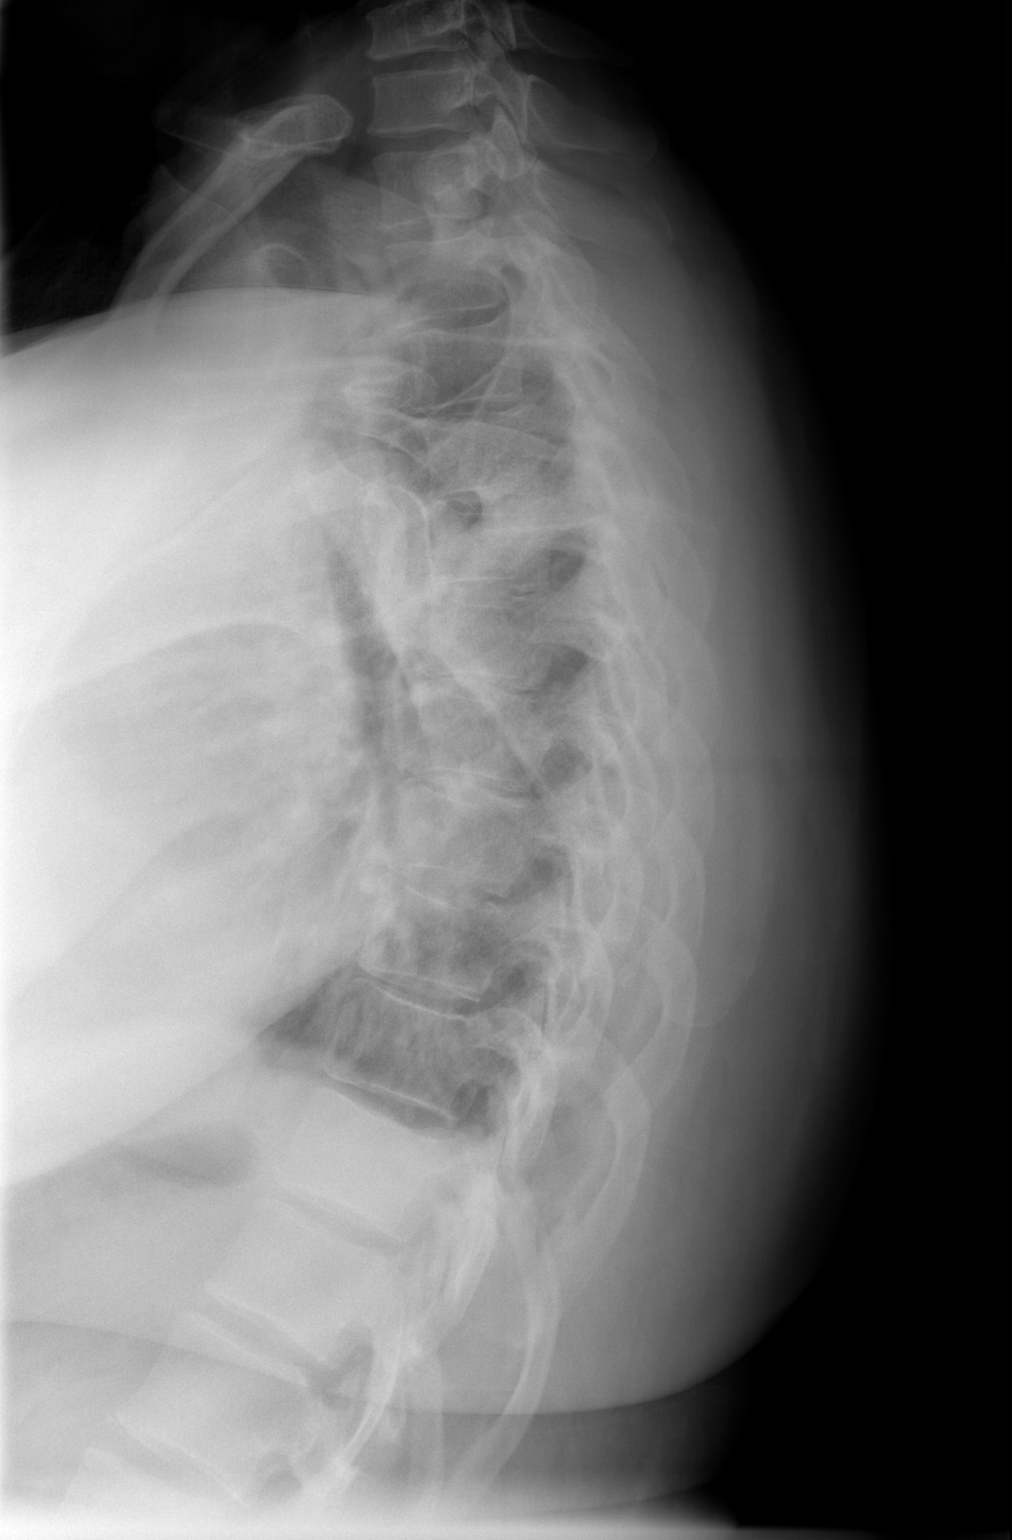

[w swimmers view]
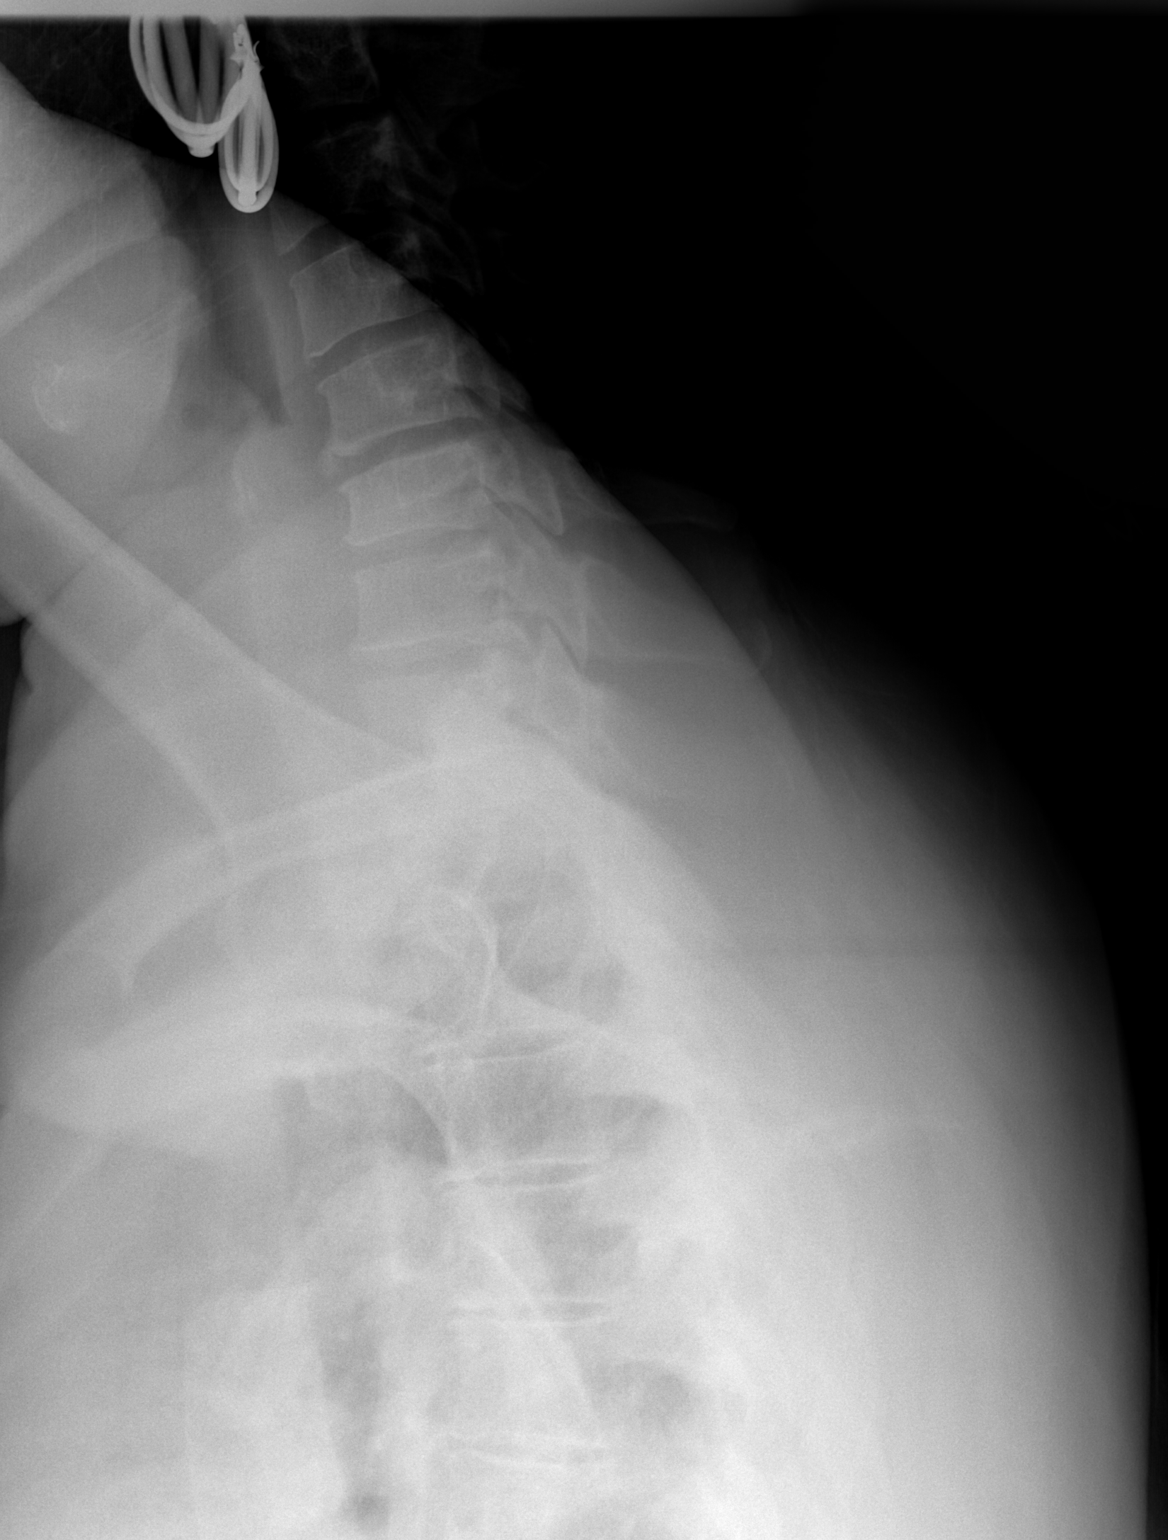

[3 of 3 positions shown; findings below may reference images not displayed]

FINDINGS: 12 rib-bearing thoracic vertebrae with anatomic posterior
alignment.  Very slight scoliosis convex right which may be at
least in part positional.  Minimal spondylosis at T11-12.  No
significant spondylosis otherwise.  Pedicles intact.
IMPRESSION: No significant abnormality.  Minimal spondylosis at T11-12.

## 2014-11-28 ENCOUNTER — Encounter: Payer: Self-pay | Admitting: Physician Assistant

## 2014-11-28 ENCOUNTER — Ambulatory Visit: Payer: Self-pay | Admitting: Internal Medicine

## 2014-11-28 ENCOUNTER — Ambulatory Visit: Payer: Self-pay | Attending: Internal Medicine | Admitting: Physician Assistant

## 2014-11-28 VITALS — BP 131/83 | HR 75 | Temp 97.7°F | Resp 18 | Ht 67.0 in | Wt 256.0 lb

## 2014-11-28 DIAGNOSIS — J0120 Acute ethmoidal sinusitis, unspecified: Secondary | ICD-10-CM | POA: Insufficient documentation

## 2014-11-28 MED ORDER — AMOXICILLIN-POT CLAVULANATE 875-125 MG PO TABS: 1.0000 | ORAL_TABLET | Freq: Two times a day (BID) | ORAL | Status: DC

## 2014-11-28 NOTE — Progress Notes (Signed)
Chief Complaint: HA and left shoulder/chest pain  Subjective: Is a 39 year old African female who is presenting with 2 complaints. First she states that she still having a lot of discomfort in the left shoulder left chest area. No radiation. No associated symptoms such as nausea, vomiting, diaphoresis. She's not had any syncope or palpitations. She hasn't had an injury to that area. She is still able to function. Her symptoms have worsened over the last couple weeks. She's not quite sure exactly was going on.  She also states she's been having headaches in the frontal aspect of her face. She's been having some soreness in the right ear. No cough. No shortness of breath.   ROS:  GEN: denies fever or chills, denies change in weight Skin: denies lesions or rashes HEENT: denies headache, earache, epistaxis, sore throat, or neck pain LUNGS: denies SHOB, dyspnea, PND, orthopnea EXT: denies muscle spasms or swelling; no pain in lower ext, no weakness   Objective:  Filed Vitals:   11/28/14 1234  BP: 131/83  Pulse: 75  Temp: 97.7 F (36.5 C)  TempSrc: Oral  Resp: 18  Height: 5\' 7"  (1.702 m)  Weight: 256 lb (116.121 kg)  SpO2: 97%    Physical Exam:  General: in no acute distress. HEENT: no pallor, no icterus, moist oral mucosa, no JVD, no lymphadenopathy Heart: Normal  s1 &s2  Regular rate and rhythm, without murmurs, rubs, gallops. Lungs: Clear to auscultation bilaterally. Abdomen: Soft, nontender, nondistended, positive bowel sounds. Extremities: No clubbing cyanosis +1 edema with positive pedal pulses.    Medications: Prior to Admission medications   Medication Sig Start Date End Date Taking? Authorizing Provider  losartan (COZAAR) 100 MG tablet Take 1 tablet (100 mg total) by mouth daily. 07/16/14  Yes Renella Cunas, MD  amoxicillin-clavulanate (AUGMENTIN) 875-125 MG per tablet Take 1 tablet by mouth 2 (two) times daily. 11/28/14   Brayton Caves, PA-C    Assessment: 1.  Left chest/shoulder pain ? Etiology, noncardiac 2. Acute Sinusitis   Plan: PRN OTC meds for pain relief Try moist heat etc Cont with risk factor modification (low salt, exercise, BP control etc) Augmentin for 10 days  Follow up:4 weeks  The patient was given clear instructions to go to ER or return to medical center if symptoms don't improve, worsen or new problems develop. The patient verbalized understanding. The patient was told to call to get lab results if they haven't heard anything in the next week.   This note has been created with Surveyor, quantity. Any transcriptional errors are unintentional.   Zettie Pho, PA-C 11/28/2014, 12:54 PM

## 2014-11-28 NOTE — Progress Notes (Signed)
Patient reports pain in left shoulder at times, pain comes and goes, currently not hurting.

## 2015-01-21 ENCOUNTER — Ambulatory Visit: Payer: Self-pay | Admitting: Internal Medicine

## 2015-02-11 ENCOUNTER — Ambulatory Visit: Payer: Self-pay | Attending: Internal Medicine | Admitting: Internal Medicine

## 2015-02-11 ENCOUNTER — Encounter: Payer: Self-pay | Admitting: Internal Medicine

## 2015-02-11 VITALS — BP 138/93 | HR 79 | Temp 98.2°F | Resp 16 | Ht 66.0 in | Wt 268.0 lb

## 2015-02-11 DIAGNOSIS — M79602 Pain in left arm: Secondary | ICD-10-CM | POA: Insufficient documentation

## 2015-02-11 DIAGNOSIS — I10 Essential (primary) hypertension: Secondary | ICD-10-CM | POA: Insufficient documentation

## 2015-02-11 LAB — BASIC METABOLIC PANEL
BUN: 12 mg/dL (ref 7–25)
CHLORIDE: 103 mmol/L (ref 98–110)
CO2: 29 mmol/L (ref 20–31)
Calcium: 9.2 mg/dL (ref 8.6–10.2)
Creat: 0.74 mg/dL (ref 0.50–1.10)
Glucose, Bld: 87 mg/dL (ref 65–99)
POTASSIUM: 4.3 mmol/L (ref 3.5–5.3)
Sodium: 141 mmol/L (ref 135–146)

## 2015-02-11 NOTE — Progress Notes (Signed)
   Subjective:    Patient ID: Jamie Wilcox, female    DOB: [birth year], 39 y.o.   MRN: [id number]  HPI Comments: Patient reports that she has been taking her blood pressure medication daily without complications. She did not take her blood pressure medication today.   Arm Pain  The incident occurred more than 1 week ago. There was no injury mechanism. The pain is present in the upper left arm. Quality: muslce tightness. The pain does not radiate. The pain is mild. The pain has been constant since the incident. Pertinent negatives include no chest pain or numbness. Exacerbated by: exercising. She has tried nothing for the symptoms. Improvement on treatment: Pain has now resolved.    Review of Systems  Cardiovascular: Negative for chest pain.  Neurological: Negative for numbness.  All other systems reviewed and are negative.      Objective:   Physical Exam  Constitutional: She is oriented to person, place, and time.  Neck: No JVD present.  Cardiovascular: Normal rate, regular rhythm and normal heart sounds.   Pulmonary/Chest: Effort normal and breath sounds normal.  Musculoskeletal: Normal range of motion. She exhibits edema (trace). She exhibits no tenderness.  Neurological: She is alert and oriented to person, place, and time.       Assessment & Plan:  Gaile was seen today for arm pain.  Diagnoses and all orders for this visit:  Left arm pain May take tylenol for pain  Essential hypertension Continue losartan. Weight loss, exercise, and DASH diet.   Return in about 6 months (around 08/12/2015) for Hypertension.  Lance Bosch, NP 02/11/2015 9:32 AM

## 2015-02-11 NOTE — Progress Notes (Signed)
Patient complains of having pain to her upper left arm for the past three days Denies any injury Patient states today the arm feels better

## 2015-02-11 NOTE — Addendum Note (Signed)
Addended by: Chari Manning A on: 02/11/2015 09:33 AM   Modules accepted: Orders

## 2015-02-12 ENCOUNTER — Telehealth: Payer: Self-pay

## 2015-02-12 NOTE — Telephone Encounter (Signed)
Tired to call patient Phone number listed is no longer in service Unable to leave message

## 2015-02-12 NOTE — Telephone Encounter (Signed)
-----   Message from Lance Bosch, NP sent at 02/11/2015  9:14 PM EST ----- Labs are within normal limits

## 2015-02-18 ENCOUNTER — Ambulatory Visit: Payer: Self-pay | Attending: Internal Medicine

## 2015-02-25 ENCOUNTER — Other Ambulatory Visit: Payer: Self-pay | Admitting: Internal Medicine

## 2015-02-27 ENCOUNTER — Ambulatory Visit: Payer: Self-pay | Attending: Internal Medicine | Admitting: Internal Medicine

## 2015-02-27 ENCOUNTER — Encounter: Payer: Self-pay | Admitting: Internal Medicine

## 2015-02-27 VITALS — BP 149/89 | HR 76 | Temp 98.2°F | Resp 16 | Ht 67.0 in | Wt 263.2 lb

## 2015-02-27 DIAGNOSIS — E669 Obesity, unspecified: Secondary | ICD-10-CM | POA: Insufficient documentation

## 2015-02-27 DIAGNOSIS — Z79899 Other long term (current) drug therapy: Secondary | ICD-10-CM | POA: Insufficient documentation

## 2015-02-27 DIAGNOSIS — Z124 Encounter for screening for malignant neoplasm of cervix: Secondary | ICD-10-CM | POA: Insufficient documentation

## 2015-02-27 DIAGNOSIS — Z1239 Encounter for other screening for malignant neoplasm of breast: Secondary | ICD-10-CM | POA: Insufficient documentation

## 2015-02-27 DIAGNOSIS — I10 Essential (primary) hypertension: Secondary | ICD-10-CM | POA: Insufficient documentation

## 2015-02-27 DIAGNOSIS — Z6841 Body Mass Index (BMI) 40.0 and over, adult: Secondary | ICD-10-CM | POA: Insufficient documentation

## 2015-02-27 LAB — HIV ANTIBODY (ROUTINE TESTING W REFLEX): HIV: NONREACTIVE

## 2015-02-27 LAB — CERVICOVAGINAL ANCILLARY ONLY: WET PREP (BD AFFIRM): NEGATIVE

## 2015-02-27 LAB — RPR

## 2015-02-27 NOTE — Progress Notes (Signed)
Patient ID: Jamie Wilcox, female   DOB: 1976-03-04, 39 y.o.   MRN: [id number]  CC: pap smear  HPI: Jamie Wilcox is a 39 y.o. female here today for a pap smear.  Patient has past medical history of hypertension and obesity. Patient denies complaints of vaginal discharge, itch, odor, or lesions. She does not have any new sexual partners. Patient has never had a mammogram and denies any breast changes over the past year. She has no complaints today.   No Known Allergies Past Medical History  Diagnosis Date  . Infection   . Hypertension    Current Outpatient Prescriptions on File Prior to Visit  Medication Sig Dispense Refill  . losartan (COZAAR) 100 MG tablet Take 1 tablet (100 mg total) by mouth daily. 90 tablet 3  . amoxicillin-clavulanate (AUGMENTIN) 875-125 MG per tablet Take 1 tablet by mouth 2 (two) times daily. 20 tablet 0   No current facility-administered medications on file prior to visit.   History reviewed. No pertinent family history. Social History   Social History  . Marital Status: Single    Spouse Name: N/A  . Number of Children: N/A  . Years of Education: N/A   Occupational History  . Not on file.   Social History Main Topics  . Smoking status: Never Smoker   . Smokeless tobacco: Not on file  . Alcohol Use: No  . Drug Use: No  . Sexual Activity: Not Currently    Birth Control/ Protection: Post-menopausal   Other Topics Concern  . Not on file   Social History Narrative    Review of Systems: Constitutional: Negative for fever, chills, diaphoresis, activity change, appetite change and fatigue. HENT: Negative for ear pain, nosebleeds, congestion, facial swelling, rhinorrhea, neck pain, neck stiffness and ear discharge.  Eyes: Negative for pain, discharge, redness, itching and visual disturbance. Respiratory: Negative for cough, choking, chest tightness, shortness of breath, wheezing and stridor.  Cardiovascular: Negative for chest pain, palpitations and leg  swelling. Gastrointestinal: Negative for abdominal distention. Genitourinary: Negative for dysuria, urgency, frequency, hematuria, flank pain, decreased urine volume, difficulty urinating and dyspareunia.  Musculoskeletal: Negative for back pain, joint swelling, arthralgias and gait problem. Neurological: Negative for dizziness, tremors, seizures, syncope, facial asymmetry, speech difficulty, weakness, light-headedness, numbness and headaches.  Hematological: Negative for adenopathy. Does not bruise/bleed easily. Psychiatric/Behavioral: Negative for hallucinations, behavioral problems, confusion, dysphoric mood, decreased concentration and agitation.    Objective:   Filed Vitals:   02/27/15 0953  BP: 149/89  Pulse: 76  Temp: 98.2 F (36.8 C)  Resp: 16    Physical Exam  Constitutional: She is oriented to person, place, and time.  Pulmonary/Chest: Right breast exhibits no mass. Left breast exhibits no mass.  Abdominal: She exhibits no distension. There is no tenderness.  Genitourinary: Vagina normal and uterus normal. No breast tenderness or discharge. Cervix exhibits no motion tenderness, no discharge and no friability. Right adnexum displays no tenderness. Left adnexum displays no tenderness.  Lymphadenopathy:       Right: No inguinal adenopathy present.       Left: No inguinal adenopathy present.  Neurological: She is alert and oriented to person, place, and time.  Skin: Skin is warm and dry.  Psychiatric: She has a normal mood and affect.     Lab Results  Component Value Date   WBC 4.4 10/28/2013   HGB 12.3 10/28/2013   HCT 37.8 10/28/2013   MCV 82.0 10/28/2013   PLT 167 10/28/2013   Lab Results  Component  Value Date   CREATININE 0.74 02/11/2015   BUN 12 02/11/2015   NA 141 02/11/2015   K 4.3 02/11/2015   CL 103 02/11/2015   CO2 29 02/11/2015    No results found for: HGBA1C Lipid Panel     Component Value Date/Time   CHOL 156 06/23/2014 1049   TRIG 85  06/23/2014 1049   HDL 57 06/23/2014 1049   CHOLHDL 2.7 06/23/2014 1049   VLDL 17 06/23/2014 1049   LDLCALC 82 06/23/2014 1049       Assessment and plan:   Jamie Wilcox was seen today for gynecologic exam.  Diagnoses and all orders for this visit:  Papanicolaou smear -     Cytology - PAP  -     HIV antibody (with reflex) -     [facility] Mammogram order. Patient given order form as well so she can call and apply for scholarship  Return if symptoms worsen or fail to improve.        Jamie Wilcox, [facility] [phone] 02/27/2015, 10:35 AM

## 2015-02-27 NOTE — Progress Notes (Signed)
Patient here for annual pap only

## 2015-03-03 LAB — CERVICOVAGINAL ANCILLARY ONLY
Chlamydia: NEGATIVE
Neisseria Gonorrhea: NEGATIVE
Trichomonas: NEGATIVE

## 2015-03-04 ENCOUNTER — Telehealth: Payer: Self-pay

## 2015-03-04 LAB — CYTOLOGY - PAP

## 2015-03-04 NOTE — Telephone Encounter (Signed)
Returned phone call to patient Patient needs to have a mammogram  Number given to patient [phone] to schedule Patient also given her most recent lab results

## 2015-03-04 NOTE — Telephone Encounter (Signed)
Patient is requesting blood work results, please f/u

## 2015-03-04 NOTE — Telephone Encounter (Signed)
Spoke with patient this PM and she is aware of her pap Results Just waiting on cytology

## 2015-03-04 NOTE — Telephone Encounter (Signed)
-----   Message from Lance Bosch, NP sent at 03/04/2015 12:39 PM EST ----- Negative for infection. Waiting on cytology report

## 2015-03-10 ENCOUNTER — Telehealth: Payer: Self-pay

## 2015-03-10 NOTE — Telephone Encounter (Signed)
-----   Message from Lance Bosch, NP sent at 03/10/2015  2:12 PM EST ----- Normal cytology. May repeat in 3 years

## 2015-03-10 NOTE — Telephone Encounter (Signed)
Tried to call patient  Patient not available Message left on voice mail to return our call

## 2015-03-13 ENCOUNTER — Telehealth: Payer: Self-pay | Admitting: Internal Medicine

## 2015-03-13 ENCOUNTER — Telehealth: Payer: Self-pay

## 2015-03-13 NOTE — Telephone Encounter (Signed)
Returned patient phone call Patient not available Left message on voice mail to return our call 

## 2015-03-13 NOTE — Telephone Encounter (Signed)
Pt. Returning call to nurse....please call patient b

## 2015-03-25 ENCOUNTER — Ambulatory Visit
Admission: RE | Admit: 2015-03-25 | Discharge: 2015-03-25 | Disposition: A | Discharge status: Home / Self Care | Payer: No Typology Code available for payment source | Source: Ambulatory Visit | Attending: Internal Medicine | Admitting: Internal Medicine

## 2015-03-25 DIAGNOSIS — Z1239 Encounter for other screening for malignant neoplasm of breast: Secondary | ICD-10-CM

## 2015-03-27 ENCOUNTER — Telehealth: Payer: Self-pay

## 2015-03-27 NOTE — Telephone Encounter (Signed)
Called patient this am Patient not available Message left on voice mail to return our call 

## 2015-03-27 NOTE — Telephone Encounter (Signed)
-----   Message from Lance Bosch, NP sent at 03/27/2015 10:31 AM EST ----- No evidence of malignancy on mammogram

## 2015-04-13 ENCOUNTER — Telehealth: Payer: Self-pay | Admitting: Internal Medicine

## 2015-04-13 ENCOUNTER — Telehealth: Payer: Self-pay

## 2015-04-13 NOTE — Telephone Encounter (Signed)
Pt. Called requesting to know her results. Please f/u with pt.

## 2015-04-13 NOTE — Telephone Encounter (Signed)
Returned patient phone call and she is aware of her  Lab results

## 2016-01-09 IMAGING — US US TRANSVAGINAL NON-OB
2 series · 13 of 25 positions shown · non-contrast
Comparison: 06/13/2012.

CLINICAL DATA: 38-year-old female with sporadic menstrual bleeding
x9 months. Initial encounter. C-section x2.



[Series 1: us pelvis complete · 3 of 16 slices shown (1 of 2)]
[im 1/16]
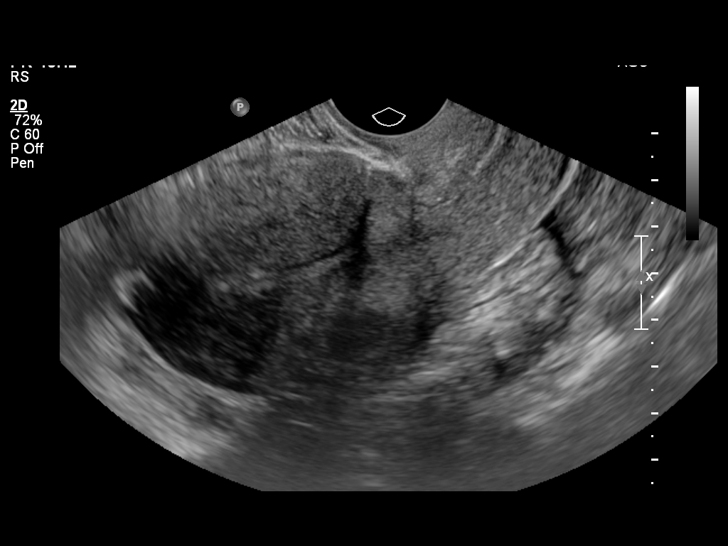
[im 8/16]
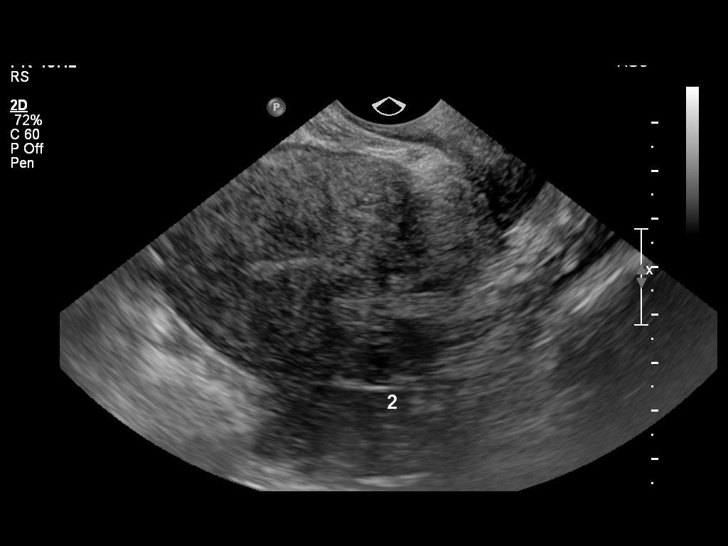
[im 16/16]
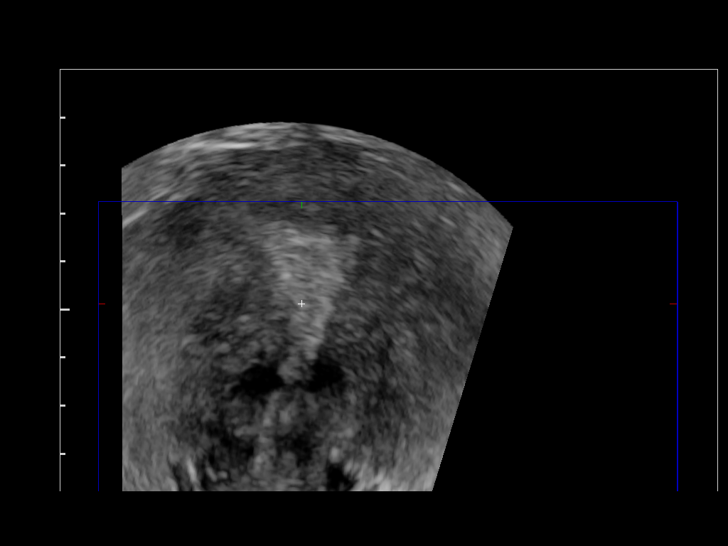

[Series 1: us pelvis complete · 10 of 60 slices shown (2 of 2)]
[im 4/60]
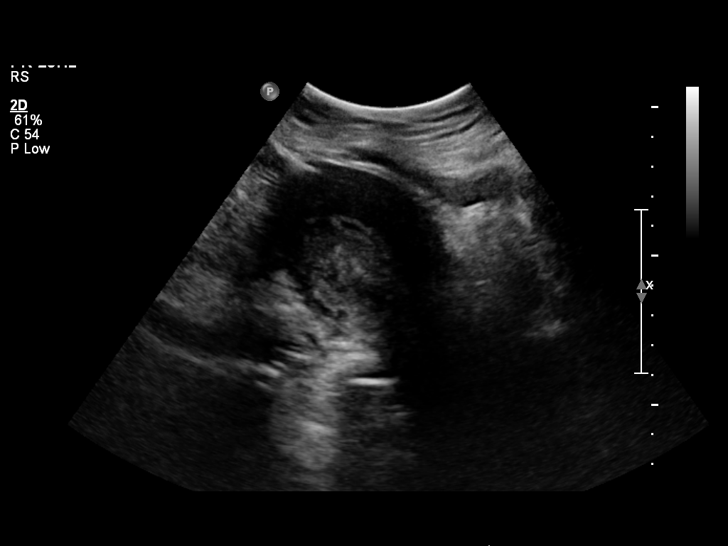
[im 10/60]
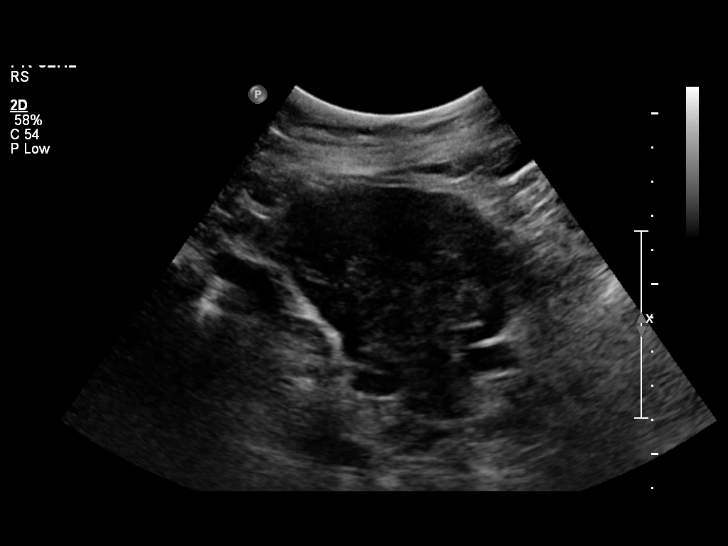
[im 16/60]
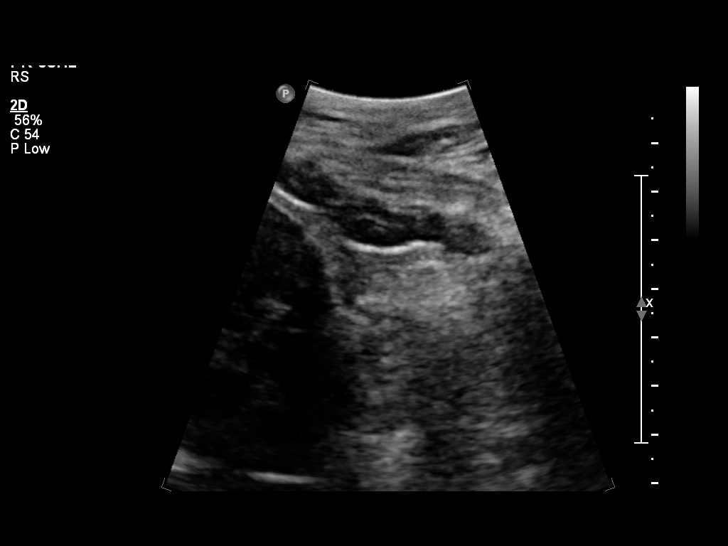
[im 22/60]
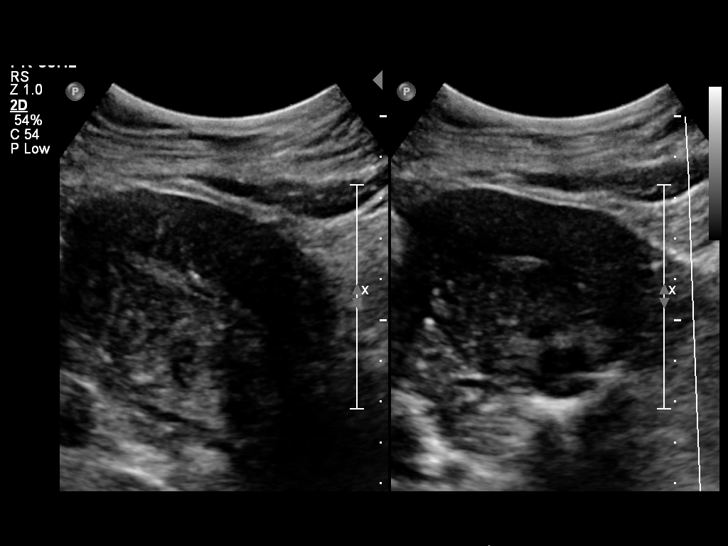
[im 28/60]
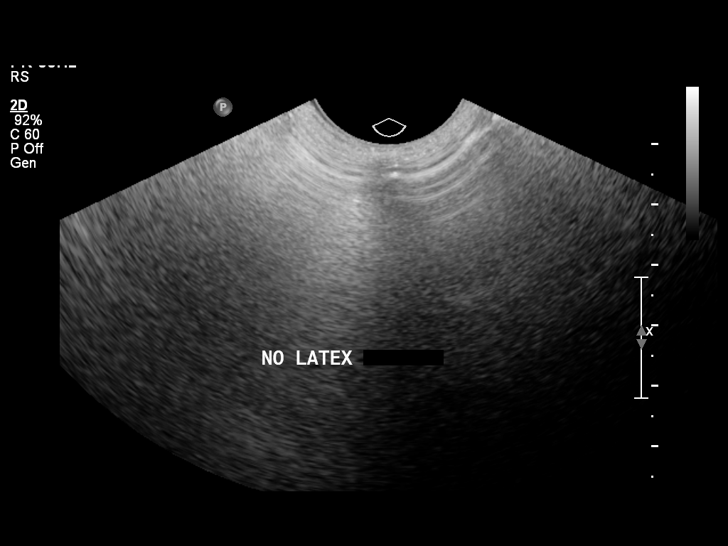
[im 35/60]
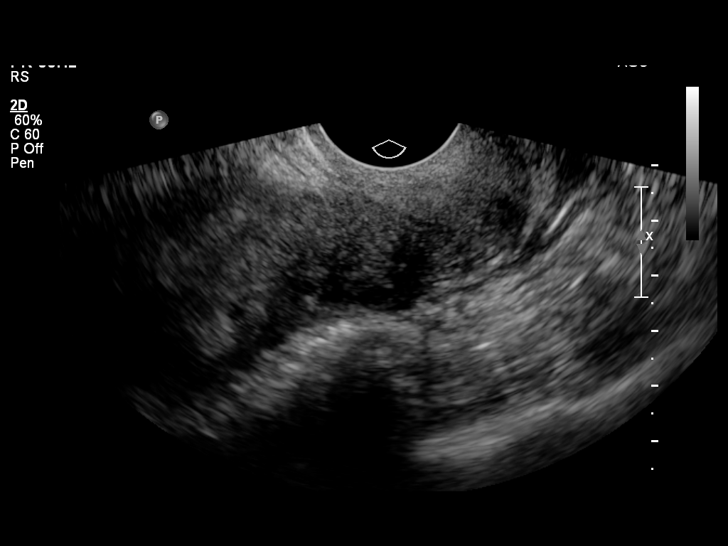
[im 41/60]
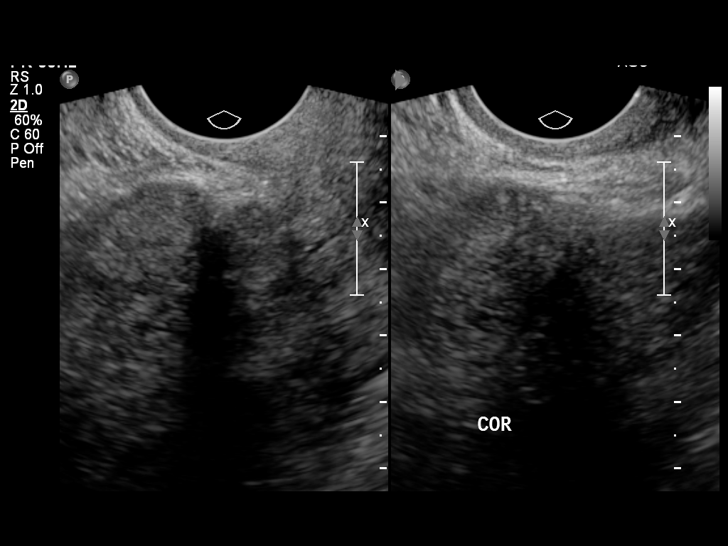
[im 47/60]
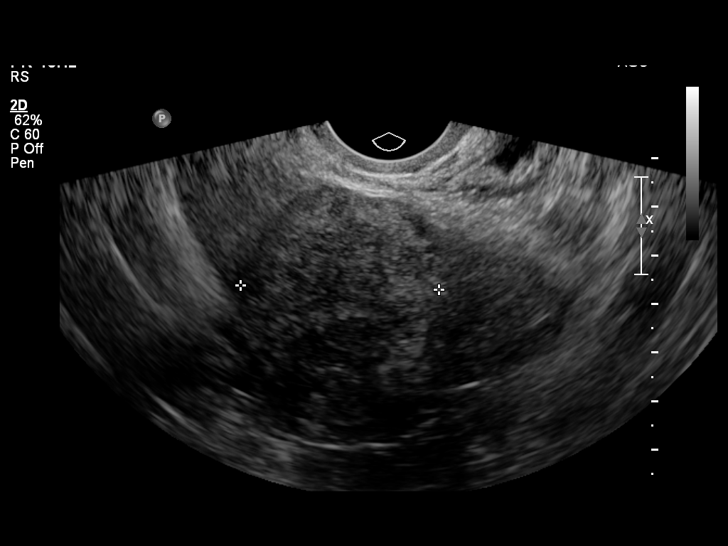
[im 53/60]
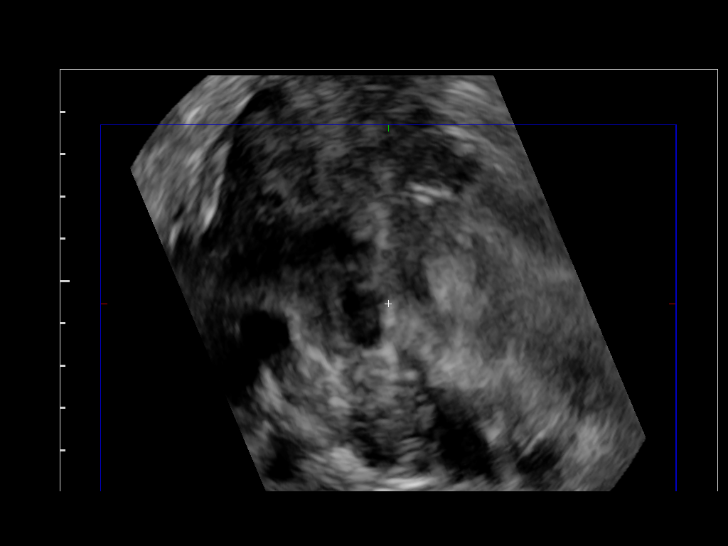
[im 60/60]
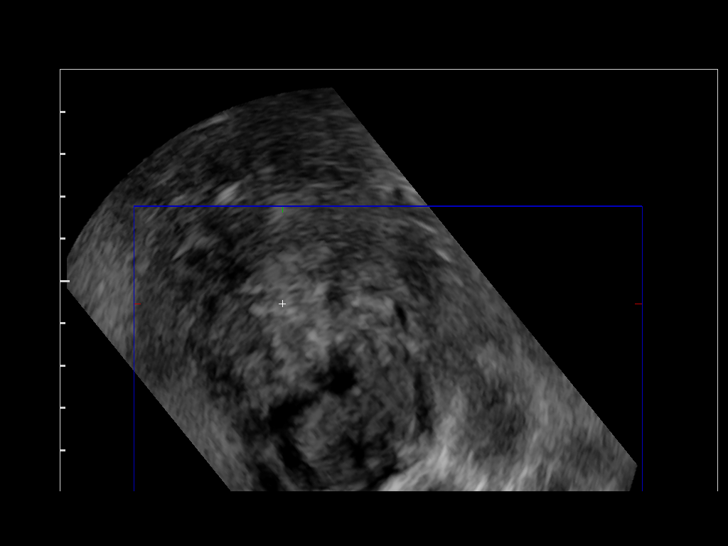

[13 of 25 positions shown; findings below may reference images not displayed]

FINDINGS: Uterus

Measurements: 10.6 x 3.5 x 6.6 cm. C-section scar incidentally
noted. There are 2 small fibroids seen in the posterior body common
measuring up to 1.6 cm diameter each. No other mass or abnormality
identified.

Endometrium

Thickness: 5 mm. Trace endometrial fluid. No focal abnormality
visualized.

Right ovary

Measurements: 1.8 x 1.6 x 1.7 cm. Normal appearance/no adnexal mass.

Left ovary

Measurements: 1.6 x 0.8 x 1.9 cm. Normal appearance/no adnexal mass.

Other findings

No free fluid.
IMPRESSION: Negative except for 2 small uterine fibroids.

## 2019-09-04 ENCOUNTER — Other Ambulatory Visit: Payer: Self-pay

## 2019-09-04 ENCOUNTER — Encounter (HOSPITAL_COMMUNITY): Payer: Self-pay | Admitting: Pediatrics

## 2019-09-04 ENCOUNTER — Emergency Department (HOSPITAL_COMMUNITY)
Admission: EM | Admit: 2019-09-04 | Discharge: 2019-09-04 | Disposition: A | Discharge status: Home / Self Care | Payer: Self-pay | Attending: Emergency Medicine | Admitting: Emergency Medicine

## 2019-09-04 DIAGNOSIS — I10 Essential (primary) hypertension: Secondary | ICD-10-CM | POA: Insufficient documentation

## 2019-09-04 DIAGNOSIS — R21 Rash and other nonspecific skin eruption: Secondary | ICD-10-CM | POA: Insufficient documentation

## 2019-09-04 DIAGNOSIS — Z79899 Other long term (current) drug therapy: Secondary | ICD-10-CM | POA: Insufficient documentation

## 2019-09-04 MED ORDER — FAMOTIDINE 40 MG PO TABS: 40.0000 mg | ORAL_TABLET | Freq: Every day | ORAL | 0 refills | Status: AC

## 2019-09-04 MED ORDER — FAMOTIDINE 20 MG PO TABS
20.0000 mg | ORAL_TABLET | Freq: Once | ORAL | Status: AC
Start: 2019-09-04 — End: 2019-09-04
  Administered 2019-09-04: 20 mg via ORAL
  Filled 2019-09-04: qty 1

## 2019-09-04 MED ORDER — DIPHENHYDRAMINE HCL 25 MG PO TABS: 25.0000 mg | ORAL_TABLET | Freq: Four times a day (QID) | ORAL | 0 refills | Status: AC | PRN

## 2019-09-04 MED ORDER — PREDNISONE 20 MG PO TABS
60.0000 mg | ORAL_TABLET | Freq: Once | ORAL | Status: AC
  Administered 2019-09-04: 60 mg via ORAL
  Filled 2019-09-04: qty 3

## 2019-09-04 MED ORDER — PREDNISONE 20 MG PO TABS: 40.0000 mg | ORAL_TABLET | Freq: Every day | ORAL | 0 refills | Status: AC

## 2019-09-04 MED ORDER — DIPHENHYDRAMINE HCL 25 MG PO CAPS
25.0000 mg | ORAL_CAPSULE | Freq: Once | ORAL | Status: AC
  Administered 2019-09-04: 25 mg via ORAL
  Filled 2019-09-04: qty 1

## 2019-09-04 NOTE — Discharge Instructions (Addendum)
You have been seen today for a rash. Please read and follow all provided instructions. Return to the emergency room for worsening condition or new concerning symptoms.    1. Medications:  -Prescription sent to your pharmacy for prednisone.  This is a steroid.  Start taking it tomorrow. -Prescription also sent for Pepcid.  This is used to help with rashes and allergic reactions. -Prescription sent for Benadryl.  This is used to help with itching.  This medicine can make you sleepy so do not drive or work while taking it.  Continue usual home medications Take medications as prescribed. Please review all of the medicines and only take them if you do not have an allergy to them.   2. Treatment: rest, drink plenty of fluids  3. Follow Up:  Please follow up with primary care provider by scheduling an appointment as soon as possible for a visit  If you do not have a primary care physician, contact [facility] at [phone] for referral   It is also a possibility that you have an allergic reaction to any of the medicines that you have been prescribed - Everybody reacts differently to medications and while MOST people have no trouble with most medicines, you may have a reaction such as nausea, vomiting, rash, swelling, shortness of breath. If this is the case, please stop taking the medicine immediately and contact your physician.  ?

## 2019-09-04 NOTE — ED Provider Notes (Signed)
[facility] Provider Note   CSN: [id number] Arrival date & time: 09/04/19  1223     History Chief Complaint  Patient presents with  . Allergic Reaction    Jamie Wilcox is a 44 y.o. female with past medical history significant for hypertension.  HPI Patient presents to emergency department today with chief complaint of rash on bilateral arms x2 days.  She is endorsing associated pruritus.  She has tried calamine lotion without any symptom improvement.  No modifying or radiating factors.  She denies history of similar rash. Denies fever, chills, contacts with persons with similar rash, or any changes in lotions/soaps/detergents, exposure to animal or plant irritants, and denies swelling or purulent discharge. No new medications or recent antibiotic use. No recent travel. No recent tick bites. No involvement to palms/soles or between webspaces. Patient does not have history of  immunocompromised. UTD on immunizations.    Past Medical History:  Diagnosis Date  . Hypertension   . Infection     Patient Active Problem List   Diagnosis Date Noted  . Chest pain 07/16/2014  . Mid back pain 07/24/2012  . Lower extremity pain- bilateral left more than right 07/24/2012  . Hypertension 07/24/2012  . Postmenopausal vaginal bleeding 06/06/2012    Past Surgical History:  Procedure Laterality Date  . CESAREAN SECTION    . TUBAL LIGATION       OB History    Gravida  3   Para  3   Term  3   Preterm      AB      Living  3     SAB      TAB      Ectopic      Multiple  1   Live Births              No family history on file.  Social History   Tobacco Use  . Smoking status: Never Smoker  Substance Use Topics  . Alcohol use: No  . Drug use: No    Home Medications Prior to Admission medications   Medication Sig Start Date End Date Taking? Authorizing Provider  diphenhydrAMINE (BENADRYL) 25 MG tablet Take 1 tablet (25 mg  total) by mouth every 6 (six) hours as needed for up to 5 days. 09/05/19 09/10/19  Trevia Nop E, PA-C  famotidine (PEPCID) 40 MG tablet Take 1 tablet (40 mg total) by mouth daily for 5 days. 08/29/19 09/03/19  Elisandro Jarrett E, PA-C  losartan (COZAAR) 100 MG tablet Take 1 tablet (100 mg total) by mouth daily. 07/16/14   Wall, Marijo Conception, MD  predniSONE (DELTASONE) 20 MG tablet Take 2 tablets (40 mg total) by mouth daily with breakfast for 5 days. 09/05/19 09/10/19  Jahzion Brogden, Harley Hallmark, PA-C    Allergies    Patient has no known allergies.  Review of Systems   Review of Systems All other systems are reviewed and are negative for acute change except as noted in the HPI.  Physical Exam Updated Vital Signs BP (!) 150/95 (BP Location: Right Wrist)   Pulse 65   Temp 98.1 F (36.7 C) (Oral)   Resp 16   Ht 5\' 4"  (1.626 m)   Wt 135.3 kg   LMP 10/24/2013   SpO2 100%   BMI 51.19 kg/m   Physical Exam Vitals and nursing note reviewed.  Constitutional:      Appearance: She is well-developed. She is not ill-appearing or toxic-appearing.  Comments: Airways patent.  There are no oral lesions.  No angioedema  HENT:     Head: Normocephalic and atraumatic.     Nose: Nose normal.  Eyes:     General: No scleral icterus.       Right eye: No discharge.        Left eye: No discharge.     Conjunctiva/sclera: Conjunctivae normal.  Neck:     Vascular: No JVD.  Cardiovascular:     Rate and Rhythm: Normal rate and regular rhythm.     Pulses: Normal pulses.     Heart sounds: Normal heart sounds.  Pulmonary:     Effort: Pulmonary effort is normal.     Breath sounds: Normal breath sounds.     Comments: No stridor.  Patient is speaking in full sentences.  No accessory muscle use oxygen saturations under percent on room air. Abdominal:     General: There is no distension.  Musculoskeletal:        General: Normal range of motion.     Cervical back: Normal range of motion.  Skin:    General: Skin  is warm and dry.     Findings: Rash present.     Comments: Maculopapular rash on bilateral forearms.  No purulent drainage  Neurological:     Mental Status: She is oriented to person, place, and time.     GCS: GCS eye subscore is 4. GCS verbal subscore is 5. GCS motor subscore is 6.     Comments: Fluent speech, no facial droop.  Psychiatric:        Behavior: Behavior normal.     ED Results / Procedures / Treatments   Labs (all labs ordered are listed, but only abnormal results are displayed) Labs Reviewed - No data to display  EKG None  Radiology No results found.  Procedures Procedures (including critical care time)  Medications Ordered in ED Medications  diphenhydrAMINE (BENADRYL) capsule 25 mg (has no administration in time range)  famotidine (PEPCID) tablet 20 mg (has no administration in time range)  predniSONE (DELTASONE) tablet 60 mg (has no administration in time range)    ED Course  I have reviewed the triage vital signs and the nursing notes.  Pertinent labs & imaging results that were available during my care of the patient were reviewed by me and considered in my medical decision making (see chart for details).    MDM Rules/Calculators/A&P                          History provided by patient with additional history obtained from chart review.    Rash consistent with contact dermititis. Patient denies any difficulty breathing or swallowing.  Pt has a patent airway without stridor and is handling secretions without difficulty; no angioedema. No blisters, no pustules, no warmth, no draining sinus tracts, no superficial abscesses, no bullous impetigo, no vesicles, no desquamation, no target lesions with dusky purpura or a central bulla. Not tender to touch. No concern for superimposed infection. No concern for SJS, TEN, TSS, tick borne illness, syphilis or other life-threatening condition. Will discharge home with short course of steroids, pepcid and recommend  Benadryl as needed for pruritis.   Portions of this note were generated with [employer]. Dictation errors may occur despite best attempts at proofreading.    Final Clinical Impression(s) / ED Diagnoses Final diagnoses:  Rash    Rx / DC Orders ED Discharge Orders  Ordered    diphenhydrAMINE (BENADRYL) 25 MG tablet  Every 6 hours PRN     Discontinue  Reprint     09/04/19 1659    predniSONE (DELTASONE) 20 MG tablet  Daily with breakfast     Discontinue  Reprint     09/04/19 1659    famotidine (PEPCID) 40 MG tablet  Daily     Reprint     09/04/19 1659           Xaria Judon, Harley Hallmark, PA-C 09/04/19 1704    Gareth Morgan, MD 09/05/19 2112

## 2019-09-04 NOTE — ED Triage Notes (Signed)
Patient c/o itchy, red rash all over body x 2 days. Unknown cause. Stated she's been using cortizone cream and has not helped.

## 2019-09-04 NOTE — ED Notes (Signed)
Pt was discharged at 1725. Ambulated out of department in NAD.

## 2022-04-14 ENCOUNTER — Emergency Department (HOSPITAL_COMMUNITY)
Admission: EM | Admit: 2022-04-14 | Discharge: 2022-04-14 | Disposition: A | Discharge status: Home / Self Care | Payer: Self-pay | Attending: Emergency Medicine | Admitting: Emergency Medicine

## 2022-04-14 ENCOUNTER — Other Ambulatory Visit: Payer: Self-pay

## 2022-04-14 ENCOUNTER — Emergency Department (HOSPITAL_COMMUNITY): Payer: Self-pay

## 2022-04-14 ENCOUNTER — Encounter (HOSPITAL_COMMUNITY): Payer: Self-pay

## 2022-04-14 DIAGNOSIS — K8050 Calculus of bile duct without cholangitis or cholecystitis without obstruction: Secondary | ICD-10-CM

## 2022-04-14 DIAGNOSIS — Z79899 Other long term (current) drug therapy: Secondary | ICD-10-CM | POA: Insufficient documentation

## 2022-04-14 DIAGNOSIS — I209 Angina pectoris, unspecified: Secondary | ICD-10-CM | POA: Insufficient documentation

## 2022-04-14 DIAGNOSIS — K8020 Calculus of gallbladder without cholecystitis without obstruction: Secondary | ICD-10-CM | POA: Insufficient documentation

## 2022-04-14 DIAGNOSIS — I2089 Other forms of angina pectoris: Secondary | ICD-10-CM

## 2022-04-14 DIAGNOSIS — G44209 Tension-type headache, unspecified, not intractable: Secondary | ICD-10-CM | POA: Insufficient documentation

## 2022-04-14 DIAGNOSIS — I10 Essential (primary) hypertension: Secondary | ICD-10-CM | POA: Insufficient documentation

## 2022-04-14 LAB — I-STAT BETA HCG BLOOD, ED (MC, WL, AP ONLY): I-stat hCG, quantitative: <5 m[IU]/mL (ref <5)

## 2022-04-14 LAB — CBC
HCT: 41.2 % (ref 36.0–46.0)
Hemoglobin: 12.9 g/dL (ref 12.0–15.0)
MCH: 26.2 pg (ref 26.0–34.0)
MCHC: 31.3 g/dL (ref 30.0–36.0)
MCV: 83.6 fL (ref 80.0–100.0)
Platelets: 212 10*3/uL (ref 150–400)
RBC: 4.93 MIL/uL (ref 3.87–5.11)
RDW: 13.7 % (ref 11.5–15.5)
WBC: 4.9 10*3/uL (ref 4.0–10.5)
nRBC: 0 % (ref 0.0–0.2)

## 2022-04-14 LAB — BASIC METABOLIC PANEL
Anion gap: 11 (ref 5–15)
BUN: 8 mg/dL (ref 6–20)
CO2: 24 mmol/L (ref 22–32)
Calcium: 9.6 mg/dL (ref 8.9–10.3)
Chloride: 104 mmol/L (ref 98–111)
Creatinine, Ser: 0.8 mg/dL (ref 0.44–1.00)
GFR, Estimated: >60 mL/min (ref >60)
Glucose, Bld: 92 mg/dL (ref 70–99)
Potassium: 3.8 mmol/L (ref 3.5–5.1)
Sodium: 139 mmol/L (ref 135–145)

## 2022-04-14 LAB — TROPONIN I (HIGH SENSITIVITY)
Troponin I (High Sensitivity): 4 ng/L (ref <18)
Troponin I (High Sensitivity): 4 ng/L (ref <18)

## 2022-04-14 MED ORDER — IOHEXOL 350 MG/ML SOLN
175.0000 mL | Freq: Once | INTRAVENOUS | Status: AC | PRN
  Administered 2022-04-14: 175 mL via INTRAVENOUS

## 2022-04-14 NOTE — Discharge Instructions (Addendum)
You were seen in the ED for your chest pain and headache in the ED. While in the ED we completed a full history and physical exam, you have no evidence of heart attack or other abnormalities in your chest. We placed an ambulatory referral to our cardiologists. Please follow-up with them outpatient please see their number above and call to set up an appointment.  Please also use the backseat of this discharge paperwork to reach out about setting up a primary care doctor.  Your CT scan showed that you have a thyroid nodule you will need to follow-up in the future to have an ultrasound performed of your thyroid for further management.  You also have gallstones in your gallbladder at this time there are no symptoms but please follow-up with your primary care doctor to let them begin the process of potentially working you up for surgery.  If you begin experiencing chest pain especially with arm numbness, jaw pain, sweating, vomiting please return the emergency department soon as possible as this could indicate a heart attack.  Sincerely, Clydie Braun, PGY-2

## 2022-04-14 NOTE — ED Triage Notes (Signed)
Pt came in via POV d/t CP & HA that has continued & really stressed her out since finding out that she has an "enlarged heart" (per pt). Since then her HA use to be intermittent but now they are continuous. But her CP started 1 month ago & is also intermittent, there are time that it keeps hurting for days & making it hard for her to eat, it will radiate into both shoulders & her back.. A/Ox4, rates pain 6/10 while in triage.

## 2022-04-14 NOTE — ED Notes (Signed)
Patient arrived back from CT

## 2022-04-14 NOTE — ED Notes (Signed)
Patient being transported to CT

## 2022-04-14 NOTE — ED Notes (Signed)
Patient assisted to the bathroom 

## 2022-04-14 NOTE — ED Notes (Signed)
Patient taken to CT scan.

## 2022-04-14 NOTE — ED Provider Notes (Signed)
[facility] Provider Note   CSN: [id number] Arrival date & time: 04/14/22  1350     History  Chief Complaint  Patient presents with   Chest Pain   Headache    Jamie Wilcox is a 47 y.o. female.  Jamie Wilcox is a 47 year old female past medical history of hypertension and immigration from [place] presenting to the emergency department for 1 month of headache.  Patient states that she was told by a doctor in [place] that she had "an enlarged heart" since that time she has been feeling increasing stress.  She states that over the past 1 month she has had approximately 4 headaches a week that happened around 3 PM or in a bandlike distribution around her head and associated with some shoulder tightness.  She also states that she has been having intermittent abdominal pain that sometimes radiates through to her back.  She states that she has not had the abdominal pain for some time now.  She also endorses intermittent chest pain that spreads across to her chest.  She denies any arm numbness, jaw pain sweating or vomiting associate with the chest pain.  As of her headache she denies any fevers or chills she denies any vision changes associate with a headache she says that they are gradual in nature and gradually resolve on their own she does not like to take medications so she is taken nothing for them.    Chest Pain Associated symptoms: headache   Headache      Home Medications Prior to Admission medications   Medication Sig Start Date End Date Taking? Authorizing Provider  diphenhydrAMINE (BENADRYL) 25 MG tablet Take 1 tablet (25 mg total) by mouth every 6 (six) hours as needed for up to 5 days. 09/05/19 09/10/19  Walisiewicz, Verline Lema E, PA-C  famotidine (PEPCID) 40 MG tablet Take 1 tablet (40 mg total) by mouth daily for 5 days. 08/29/19 09/03/19  Walisiewicz, Verline Lema E, PA-C  losartan (COZAAR) 100 MG tablet Take 1 tablet (100 mg total) by mouth  daily. 07/16/14   Wall, Marijo [facility], MD      Allergies    Patient has no known allergies.    Review of Systems   Review of Systems  Cardiovascular:  Positive for chest pain.  Neurological:  Positive for headaches.    Physical Exam Updated Vital Signs BP (!) 175/95   Pulse 64   Temp 98.2 F (36.8 C) (Oral)   Resp 18   Ht 5' 9"$  (1.753 m)   Wt 129.7 kg   LMP 10/24/2013   SpO2 100%   BMI 42.23 kg/m  Physical Exam Vitals and nursing note reviewed.  Constitutional:      General: She is not in acute distress.    Appearance: She is well-developed.  HENT:     Head: Normocephalic and atraumatic.  Eyes:     Conjunctiva/sclera: Conjunctivae normal.  Cardiovascular:     Rate and Rhythm: Normal rate and regular rhythm.     Pulses:          Radial pulses are 2+ on the right side and 2+ on the left side.       Dorsalis pedis pulses are 2+ on the right side and 2+ on the left side.     Heart sounds: No murmur heard.    No systolic murmur is present.     No diastolic murmur is present.     No S3 sounds.  Pulmonary:  Effort: Pulmonary effort is normal. No respiratory distress.     Breath sounds: Normal breath sounds. No decreased breath sounds, wheezing or rhonchi.  Abdominal:     Palpations: Abdomen is soft. There is no hepatomegaly or splenomegaly.     Tenderness: There is no abdominal tenderness. There is no guarding or rebound.     Comments: Murphy's sign negative  Musculoskeletal:        General: No swelling.     Cervical back: Neck supple.  Skin:    General: Skin is warm and dry.     Capillary Refill: Capillary refill takes less than 2 seconds.  Neurological:     Mental Status: She is alert.  Psychiatric:        Mood and Affect: Mood normal.     ED Results / Procedures / Treatments   Labs (all labs ordered are listed, but only abnormal results are displayed) Labs Reviewed  BASIC METABOLIC PANEL  CBC  I-STAT BETA HCG BLOOD, ED ([facility], [facility], [facility] ONLY)  TROPONIN I (HIGH  SENSITIVITY)  TROPONIN I (HIGH SENSITIVITY)    EKG EKG Interpretation  Date/Time:  Thursday April 14 2022 13:59:00 EST Ventricular Rate:  71 PR Interval:  176 QRS Duration: 92 QT Interval:  394 QTC Calculation: 428 R Axis:   -10 Text Interpretation: Normal sinus rhythm Cannot rule out Anterior infarct , age undetermined Abnormal ECG When compared with ECG of 23-Jun-2014 11:11, PREVIOUS ECG IS PRESENT Confirmed by Noemi Chapel [id number]) on 04/14/2022 3:12:39 PM  Radiology CT Angio Chest/Abd/Pel for Dissection W and/or Wo Contrast  Result Date: 04/14/2022 CLINICAL DATA:  Acute aortic syndrome (AAS) suspected EXAM: CT ANGIOGRAPHY CHEST, ABDOMEN AND PELVIS TECHNIQUE: Non-contrast CT of the chest was initially obtained. Multidetector CT imaging through the chest, abdomen and pelvis was performed using the standard protocol during bolus administration of intravenous contrast. Multiplanar reconstructed images and MIPs were obtained and reviewed to evaluate the vascular anatomy. RADIATION DOSE REDUCTION: This exam was performed according to the departmental dose-optimization program which includes automated exposure control, adjustment of the mA and/or kV according to patient size and/or use of iterative reconstruction technique. CONTRAST:  174m OMNIPAQUE IOHEXOL 350 MG/ML SOLN COMPARISON:  None Available. FINDINGS: CTA CHEST FINDINGS Cardiovascular: Preferential opacification of the thoracic aorta. No evidence of thoracic aortic aneurysm or dissection. Normal heart size. No significant pericardial effusion. No atherosclerotic plaque of the thoracic aorta. No coronary artery calcifications. The main pulmonary artery is enlarged in caliber measuring up to 3.2 cm. No central or proximal segmental pulmonary embolus. Limited evaluation more distally due to timing of contrast. Mediastinum/Nodes: No enlarged mediastinal, hilar, or axillary lymph nodes. Trachea and esophagus demonstrate no significant findings.  1.5 cm left thyroid gland hypodense nodule (5:14). Tiny hiatal hernia. Lungs/Pleura: Expiratory phase of respiration. No focal consolidation. No pulmonary nodule. No pulmonary mass. No pleural effusion. No pneumothorax. Musculoskeletal: No chest wall abnormality. No suspicious lytic or blastic osseous lesions. No acute displaced fracture. Review of the MIP images confirms the above findings. CTA ABDOMEN AND PELVIS FINDINGS VASCULAR Aorta: Mild atherosclerotic plaque. Normal caliber aorta without aneurysm, dissection, vasculitis or significant stenosis. Celiac: Patent without evidence of aneurysm, dissection, vasculitis or significant stenosis. SMA: Patent without evidence of aneurysm, dissection, vasculitis or significant stenosis. Renals: Both renal arteries are patent without evidence of aneurysm, dissection, vasculitis, fibromuscular dysplasia or significant stenosis. IMA: Patent without evidence of aneurysm, dissection, vasculitis or significant stenosis. Inflow: Patent without evidence of aneurysm, dissection, vasculitis or significant stenosis. Veins: No  obvious venous abnormality within the limitations of this arterial phase study. Review of the MIP images confirms the above findings. NON-VASCULAR Hepatobiliary: No focal liver abnormality. Calcified gallstone noted within the gallbladder lumen. No gallbladder wall thickening or pericholecystic fluid. No biliary dilatation. Pancreas: No focal lesion. Normal pancreatic contour. No surrounding inflammatory changes. No main pancreatic ductal dilatation. Spleen: Normal in size without focal abnormality. Adrenals/Urinary Tract: No adrenal nodule bilaterally. Bilateral kidneys enhance and excrete symmetrically. No hydronephrosis. No hydroureter. The urinary bladder is unremarkable. Stomach/Bowel: Stomach is within normal limits. No evidence of bowel wall thickening or dilatation. Appendix appears normal. Lymphatic: No lymphadenopathy. Reproductive: Uterus and  bilateral adnexa are unremarkable. Other: No intraperitoneal free fluid. No intraperitoneal free gas. No organized fluid collection. Musculoskeletal: No abdominal wall hernia or abnormality.  Diastasis rectus. No suspicious lytic or blastic osseous lesions. No acute displaced fracture. Review of the MIP images confirms the above findings. IMPRESSION: 1. No acute aortic abnormality. 2.  Aortic Atherosclerosis (ICD10-I70.0) - trace. 3. No central or proximal segmental pulmonary embolus. Limited evaluation more distally due to timing of contrast. Main pulmonary artery measures slightly enlarged-correlate for pulmonary hypertension. 4. A 1.5 cm left thyroid gland nodule. Recommend thyroid US (ref: J Am Coll Radiol. 2015 Feb;12(2): 143-50). 5. Small hiatal hernia. 6. Cholelithiasis with no CT finding of acute cholecystitis. Electronically Signed   By: Iven Finn M.D.   On: 04/14/2022 19:20   CT Angio Head W or Wo Contrast  Result Date: 04/14/2022 CLINICAL DATA:  Cerebral aneurysm screening, high-risk. EXAM: CT HEAD WITHOUT CONTRAST CT ANGIOGRAPHY OF THE HEAD TECHNIQUE: Contiguous axial images were obtained from the base of the skull through the vertex without intravenous contrast. Multidetector CT imaging of the head was performed using the standard protocol during bolus administration of intravenous contrast. 3D Multiplanar CT post processing and maximum intensity projections (MIPs) were obtained to evaluate the vascular anatomy. RADIATION DOSE REDUCTION: This exam was performed according to the departmental dose-optimization program which includes automated exposure control, adjustment of the mA and/or kV according to patient size and/or use of iterative reconstruction technique. CONTRAST:  149m OMNIPAQUE IOHEXOL 350 MG/ML SOLN COMPARISON:  None Available. FINDINGS: CT HEAD Brain: Patchy hypoattenuation of the subcortical white matter, favored to reflect chronic small-vessel disease. Age-indeterminate lacunar  infarct in the left caudate. Cortical gray-white differentiation is preserved. No acute intracranial hemorrhage. No hydrocephalus or extra-axial collection. Vascular: No hyperdense vessel or unexpected calcification. Skull: No calvarial fracture or suspicious bone lesion. Skull base is unremarkable. Sinuses/Orbits: Paranasal sinuses, mastoid air cells, and middle ear cavities are well aerated. Orbits are unremarkable. CTA HEAD Anterior circulation: Intracranial ICAs are patent without stenosis or aneurysm. The proximal ACAs and MCAs are patent without stenosis or aneurysm. Distal branches are symmetric. Posterior circulation: Normal basilar artery. The SCAs, AICAs and PICAs are patent proximally. The PCAs are patent proximally without stenosis or aneurysm. Distal branches are symmetric. Venous sinuses: Early phase of contrast. Anatomic variants: None. IMPRESSION: 1. No intracranial aneurysm. 2. Age-indeterminate lacunar infarct in the left caudate. 3. Patchy hypoattenuation of the subcortical white matter, favored to reflect chronic small-vessel disease. Differential considerations include vasculitis and other inflammatory etiologies. No evidence of large or medium vessel vasculitis on CTA. Electronically Signed   By: WEmmit AlexandersM.D.   On: 04/14/2022 18:59   DG Chest 2 View  Result Date: 04/14/2022 CLINICAL DATA:  Chest pain and headache. EXAM: CHEST - 2 VIEW COMPARISON:  None Available. FINDINGS: The lungs are adequately inflated without  focal airspace consolidation, effusion or pneumothorax. Mild cardiomegaly. Prominent overlying soft tissues. Minimal degenerative change of the spine. IMPRESSION: 1. No acute cardiopulmonary disease. 2. Mild cardiomegaly. Electronically Signed   By: Marin Olp M.D.   On: 04/14/2022 14:42    Procedures Procedures    Medications Ordered in ED Medications  iohexol (OMNIPAQUE) 350 MG/ML injection 175 mL (175 mLs Intravenous Contrast Given 04/14/22 1847)    ED  Course/ Medical Decision Making/ A&P             HEART Score: 3                Medical Decision Making Jamie Wilcox is a 47 year old female past medical history as documented above presenting to the emergency department for headache, abdominal pain, chest pain.  From a headache perspective initial differential includes tension headache, subarachnoid hemorrhage, aneurysm, migraine.  Etiology of her headache correlates mostly with tension headache given the timing of headache given the fact that is midday as well as a bandlike pattern and shoulder tightness associated with it.  Feel this less likely a subarachnoid hemorrhage or an acute aneurysm as headache is not sudden in onset has no associated neurological findings and has been waxing and waning for some time.  Patient is not meningitic on physical exam is afebrile and is hemodynamically stable making infectious etiology less likely.  Given the fact that she is having new onset headache we did order a CTA head.  There was a small age-indeterminate infarct in the left caudate.  However patient's neuro him in the emergency department today is completely benign.  Given this fact I am less concerned for an acute stroke.  There is no evidence of aneurysmal dilation or carotid artery dissection.  Terms of patient's chest pain patient's initial EKG shows no acute ST segment elevations or depressions axis is grossly normal intervals are grossly within normal limits.  Patient's hear score is a 3 based off of her prior risk factors as well as the fact that her chest pain is atypical in nature.  She had 2 negative troponins.  We opted to obtain a CT aortogram given the fact that patient's chest pain was radiating through to her back and she has a history of hypertension there is no evidence of thoracic or abdominal aortic distention nor dissection.  Lung parenchyma grossly within normal limits.  Patient does not have a prior history of ACS but given her body  habitus and past risk factors we decided to place her in the ambulatory cardiology referral pathway.  Patient was comfortable with this plan.  As of her abdominal pain patient CT scan shows cholelithiasis.  She states that her pain is incredibly intermittent and she has not had some pain for some time today in the emergency department she is afebrile nontachycardic she has no abdominal tenderness and is Murphy sign negative.  At this time not concerned for cholecystitis.  No evidence of pancreatic inflammation on CT scan patient is not vomiting and tolerating p.o. making pancreatitis less likely.  Overall, patient will follow-up with her primary care doctor in the outpatient setting for further workup of cholelithiasis and potential intermittent biliary colic.  A long discussion with the patient's daughter and patient at bedside.  Patient is comfortable with discharge home at this time will follow-up with cardiology in the outpatient setting she will begin taking Tylenol and ibuprofen for intermittent headaches given warning signs for headaches as well as warning signs for chest pain.  Patient  was subsequently discharged home to self-care.     Amount and/or Complexity of Data Reviewed Labs: ordered. Radiology: ordered.  Risk Prescription drug management.           Final Clinical Impression(s) / ED Diagnoses Final diagnoses:  Stable angina pectoris  Acute non intractable tension-type headache  Biliary colic    Rx / DC Orders ED Discharge Orders          Ordered    Ambulatory referral to Cardiology       Comments: If you have not heard from the Cardiology office within the next 72 hours please call [phone].   04/14/22 1954              Donzetta Matters, MD 04/14/22 2034    Noemi Chapel, MD 04/17/22 1901

## 2022-04-22 ENCOUNTER — Ambulatory Visit: Payer: Self-pay | Attending: Internal Medicine | Admitting: Internal Medicine

## 2022-04-22 ENCOUNTER — Encounter: Payer: Self-pay | Admitting: Internal Medicine

## 2022-04-22 VITALS — BP 150/70 | HR 65 | Ht 66.0 in | Wt 284.8 lb

## 2022-04-22 DIAGNOSIS — I10 Essential (primary) hypertension: Secondary | ICD-10-CM

## 2022-04-22 DIAGNOSIS — Z5989 Other problems related to housing and economic circumstances: Secondary | ICD-10-CM | POA: Insufficient documentation

## 2022-04-22 DIAGNOSIS — I517 Cardiomegaly: Secondary | ICD-10-CM | POA: Insufficient documentation

## 2022-04-22 MED ORDER — AMLODIPINE BESYLATE 5 MG PO TABS: 5.0000 mg | ORAL_TABLET | Freq: Every day | ORAL | 3 refills | Status: AC

## 2022-04-22 NOTE — Progress Notes (Signed)
Cardiology Office Note:    Date:  04/22/2022   ID:  Jamie Wilcox, DOB 07-16-1975, MRN [id number]  PCP:  Patient, No Pcp Per   [place] [facility] Providers Cardiologist:  Werner Lean, MD     Referring MD: Noemi Chapel, MD   CC: Heart ok Consulted for the evaluation of cardiomegaly at the behest of Dr. Sabra Heck  History of Present Illness:    Jamie Wilcox is a 47 y.o. female with a hx of HTN  and cardiomegaly NOS.  Provider note: Patient has limited [place].  Son in [place] does not speak [place].  Steele Sizer is present (and 47 years old).  Daugther speaks Twi but is on intermittently via speaker phone.  Twi interpretor service not available.  In future, would be best served with live interpretor.  Patient was having chest pain in evaluation went to ED.  Before this, in the states, she saw a [place] Doctor here and had an CXR finding of cardiomegaly.  Worried about her heart.  Had benign cardiac work up in ED.  She does not have citizenship or insurance.  Is awaiting PCP. She takes no medication.  She does not take her BP at home.  Patient notes that she is doing fine now.   Since last visit notes that occasional gets headaches .  No chest pain or pressure .  No SOB/DOE and no PND/Orthopnea.  No weight gain or leg swelling.  No palpitations or syncope . No family history of heart problems.   Past Medical History:  Diagnosis Date   Hypertension    Infection     Past Surgical History:  Procedure Laterality Date   CESAREAN SECTION     TUBAL LIGATION      Current Medications: Current Meds  Medication Sig   amLODipine (NORVASC) 5 MG tablet Take 1 tablet (5 mg total) by mouth daily.   diphenhydrAMINE (BENADRYL) 25 MG tablet Take 1 tablet (25 mg total) by mouth every 6 (six) hours as needed for up to 5 days.   famotidine (PEPCID) 40 MG tablet Take 1 tablet (40 mg total) by mouth daily for 5 days.   losartan (COZAAR) 100 MG tablet Take 1 tablet (100 mg total) by mouth  daily.     Allergies:   Patient has no known allergies.   Social History   Socioeconomic History   Marital status: Single    Spouse name: Not on file   Number of children: Not on file   Years of education: Not on file   Highest education level: Not on file  Occupational History   Not on file  Tobacco Use   Smoking status: Never   Smokeless tobacco: Not on file  Substance and Sexual Activity   Alcohol use: No   Drug use: No   Sexual activity: Not Currently    Birth control/protection: Post-menopausal  Other Topics Concern   Not on file  Social History Narrative   Not on file   Social Determinants of Health   Financial Resource Strain: Not on file  Food Insecurity: Not on file  Transportation Needs: Not on file  Physical Activity: Not on file  Stress: Not on file  Social Connections: Not on file     Family History: No history of heart problems.  ROS:   Please see the history of present illness.     All other systems reviewed and are negative.  EKGs/Labs/Other Studies Reviewed:    The following studies were reviewed today:  EKG:   04/15/22: SR rate 71  Recent Labs: 04/14/2022: BUN 8; Creatinine, Ser 0.80; Hemoglobin 12.9; Platelets 212; Potassium 3.8; Sodium 139  Recent Lipid Panel    Component Value Date/Time   CHOL 156 06/23/2014 1049   TRIG 85 06/23/2014 1049   HDL 57 06/23/2014 1049   CHOLHDL 2.7 06/23/2014 1049   VLDL 17 06/23/2014 1049   LDLCALC 82 06/23/2014 1049     Risk Assessment/Calculations:    HYPERTENSION CONTROL Vitals:   04/22/22 1545 04/22/22 1806  BP: (!) 152/70 (!) 150/70    The patient's blood pressure is elevated above target today.  In order to address the patient's elevated BP: A new medication was prescribed today.            Physical Exam:    VS:  BP (!) 150/70   Pulse 65   Ht 5' 6"$  (1.676 m)   Wt 284 lb 12.8 oz (129.2 kg)   LMP 10/24/2013   SpO2 99%   BMI 45.97 kg/m     Wt Readings from Last 3  Encounters:  04/22/22 284 lb 12.8 oz (129.2 kg)  04/14/22 286 lb (129.7 kg)  09/04/19 298 lb 4 oz (135.3 kg)    GEN:  Morbid obesity no acute distress HEENT: Normal NECK: No JVD; No carotid bruits LYMPHATICS: No lymphadenopathy CARDIAC: RRR, no murmurs, rubs, gallops RESPIRATORY:  Clear to auscultation without rales, wheezing or rhonchi  ABDOMEN: Soft, non-tender, non-distended MUSCULOSKELETAL:  No edema; No deformity  SKIN: Warm and dry NEUROLOGIC:  Alert and oriented x 3 PSYCHIATRIC:  Normal affect   ASSESSMENT:    1. Cardiomegaly   2. Does not have health insurance   3. Primary hypertension    PLAN:    HTN Cardiomegaly NOS - reviewed her CT with family; I suspect she has LVH  - will get echo - will start amlodipine 54m  She has a thryroid nodule on that CT that needs outpatient follow up  HLD - LDL goal < 70; needs fasting lipids  Does not have health insruance - need PCP to lead care - limited by lack of insurance - we have reached out to social work - we have deferred Pharm D eval for now  If Echo is ok and has PCP in place, PRN f/u If not will get APP f/u in 5-6 months At that time will need interpretor.   Time Spent Directly with Patient:   I have spent a total of 60 minutes with the patient reviewing notes, imaging, EKGs, labs and examining the patient as well as establishing an assessment and plan that was discussed personally with the patient.  > 50% of time was spent in direct patient care and family and reviewing imaging with patient.          Medication Adjustments/Labs and Tests Ordered: Current medicines are reviewed at length with the patient today.  Concerns regarding medicines are outlined above.  Orders Placed This Encounter  Procedures   Ambulatory referral to Social Work   ECHOCARDIOGRAM COMPLETE   Meds ordered this encounter  Medications   amLODipine (NORVASC) 5 MG tablet    Sig: Take 1 tablet (5 mg total) by mouth daily.     Dispense:  90 tablet    Refill:  3    Patient Instructions  Medication Instructions:  Your physician has recommended you make the following change in your medication:  START: amlodipine (Norvasc) 5 mg by mouth once daily  *If you need a refill  on your cardiac medications before your next appointment, please call your pharmacy*   Lab Work: NONE If you have labs (blood work) drawn today and your tests are completely normal, you will receive your results only by: [place] (if you have MyChart) OR A paper copy in the mail If you have any lab test that is abnormal or we need to change your treatment, we will call you to review the results.   Testing/Procedures: Your physician has requested that you have an echocardiogram. Echocardiography is a painless test that uses sound waves to create images of your heart. It provides your doctor with information about the size and shape of your heart and how well your heart's chambers and valves are working. This procedure takes approximately one hour. There are no restrictions for this procedure. Please do NOT wear cologne, perfume, aftershave, or lotions (deodorant is allowed). Please arrive 15 minutes prior to your appointment time.    Follow-Up: At [facility], you and your health needs are our priority.  As part of our continuing mission to provide you with exceptional heart care, we have created designated Provider Care Teams.  These Care Teams include your primary Cardiologist (physician) and Advanced Practice Providers (APPs -  Physician Assistants and Nurse Practitioners) who all work together to provide you with the care you need, when you need it.  We recommend signing up for the patient portal called "MyChart".  Sign up information is provided on this After Visit Summary.  MyChart is used to connect with patients for Virtual Visits (Telemedicine).  Patients are able to view lab/test results, encounter notes, upcoming  appointments, etc.  Non-urgent messages can be sent to your provider as well.   To learn more about what you can do with MyChart, go to [email or web address].    Your next appointment:   5-6 month(s)  Provider:   Werner Lean, MD     Other Instructions Please monitor your Blood pressure   Signed, Werner Lean, MD  04/22/2022 6:10 PM    [place]

## 2022-04-22 NOTE — Patient Instructions (Addendum)
Medication Instructions:  Your physician has recommended you make the following change in your medication:  START: amlodipine (Norvasc) 5 mg by mouth once daily  *If you need a refill on your cardiac medications before your next appointment, please call your pharmacy*   Lab Work: NONE If you have labs (blood work) drawn today and your tests are completely normal, you will receive your results only by: [place] (if you have MyChart) OR A paper copy in the mail If you have any lab test that is abnormal or we need to change your treatment, we will call you to review the results.   Testing/Procedures: Your physician has requested that you have an echocardiogram. Echocardiography is a painless test that uses sound waves to create images of your heart. It provides your doctor with information about the size and shape of your heart and how well your heart's chambers and valves are working. This procedure takes approximately one hour. There are no restrictions for this procedure. Please do NOT wear cologne, perfume, aftershave, or lotions (deodorant is allowed). Please arrive 15 minutes prior to your appointment time.    Follow-Up: At [facility], you and your health needs are our priority.  As part of our continuing mission to provide you with exceptional heart care, we have created designated Provider Care Teams.  These Care Teams include your primary Cardiologist (physician) and Advanced Practice Providers (APPs -  Physician Assistants and Nurse Practitioners) who all work together to provide you with the care you need, when you need it.  We recommend signing up for the patient portal called "MyChart".  Sign up information is provided on this After Visit Summary.  MyChart is used to connect with patients for Virtual Visits (Telemedicine).  Patients are able to view lab/test results, encounter notes, upcoming appointments, etc.  Non-urgent messages can be sent to your provider as  well.   To learn more about what you can do with MyChart, go to [email or web address].    Your next appointment:   5-6 month(s)  Provider:   Werner Lean, MD     Other Instructions Please monitor your Blood pressure

## 2022-04-28 ENCOUNTER — Encounter (HOSPITAL_COMMUNITY): Payer: Self-pay | Admitting: Internal Medicine

## 2022-05-10 ENCOUNTER — Telehealth (HOSPITAL_COMMUNITY): Payer: Self-pay | Admitting: Internal Medicine

## 2022-05-10 NOTE — Telephone Encounter (Signed)
Just an FYI. We have made several attempts to contact this patient including sending a letter to schedule or reschedule their echocardiogram. We will be removing the patient from the echo WQ.    MAILED LETTER [facility] 04/28/22 called and # could not be completed as dialed. [facility] 04/25/22 Called and call could not be completed at this time-pt was in the office on Friday. [facility]     Thank you

## 2022-09-29 ENCOUNTER — Encounter: Payer: Self-pay | Admitting: Internal Medicine

## 2022-09-29 ENCOUNTER — Ambulatory Visit: Payer: Self-pay | Attending: Internal Medicine | Admitting: Internal Medicine

## 2022-09-29 NOTE — Progress Notes (Deleted)
Cardiology Office Note:    Date:  09/29/2022   ID:  Jamie Wilcox, DOB 09/12/1975, MRN [id number]  PCP:  Patient, No Pcp Per   [facility] Providers Cardiologist:  Christell Constant, MD     Referring MD: No ref. provider found   CC: Heart ok Consulted for the evaluation of cardiomegaly at the behest of Dr. Hyacinth Meeker  History of Present Illness:    Jamie Wilcox is a 47 y.o. female with a hx of HTN  and cardiomegaly NOS.  Provider note: Patient has limited [place].  Son in [place] does not speak [place].  Sable Feil is present (and 48 years old).  Daugther speaks Twi but is on intermittently via speaker phone.  Twi interpretor service not available.  In future, would be best served with live interpretor.  Patient was having chest pain in evaluation went to ED.  [place] Doctor here and had an CXR finding of cardiomegaly.  Worried about her heart.  Had benign cardiac work up in ED.  She does not have citizenship or insurance.  Is awaiting PCP. She takes no medication.  She does not take her BP at home.  Patient notes that she is doing fine now.   Since last visit notes that occasional gets headaches .  No chest pain or pressure .  No SOB/DOE and no PND/Orthopnea.  No weight gain or leg swelling.  No palpitations or syncope . No family history of heart problems.   Past Medical History:  Diagnosis Date   Hypertension    Infection     Past Surgical History:  Procedure Laterality Date   CESAREAN SECTION     TUBAL LIGATION      Current Medications: No outpatient medications have been marked as taking for the 09/29/22 encounter (Appointment) with Christell Constant, MD.     Allergies:   Patient has no known allergies.   Social History   Socioeconomic History   Marital status: Single    Spouse name: Not on file   Number of children: Not on file   Years of education: Not on file   Highest education level: Not on file   Occupational History   Not on file  Tobacco Use   Smoking status: Never   Smokeless tobacco: Not on file  Substance and Sexual Activity   Alcohol use: No   Drug use: No   Sexual activity: Not Currently    Birth control/protection: Post-menopausal  Other Topics Concern   Not on file  Social History Narrative   Not on file   Social Determinants of Health   Financial Resource Strain: Not on file  Food Insecurity: Not on file  Transportation Needs: Not on file  Physical Activity: Not on file  Stress: Not on file  Social Connections: Not on file     Family History: No history of heart problems.  ROS:   Please see the history of present illness.     All other systems reviewed and are negative.  EKGs/Labs/Other Studies Reviewed:    The following studies were reviewed today:   EKG:   04/15/22: SR rate 71  Recent Labs: 04/14/2022: BUN 8; Creatinine, Ser 0.80; Hemoglobin 12.9; Platelets 212; Potassium 3.8; Sodium 139  Recent Lipid Panel    Component Value Date/Time   CHOL 156 06/23/2014 1049   TRIG 85 06/23/2014 1049   HDL 57 06/23/2014 1049   CHOLHDL 2.7 06/23/2014 1049   VLDL 17 06/23/2014  1049   LDLCALC 82 06/23/2014 1049     Risk Assessment/Calculations:    No BP recorded.  {Refresh Note OR Click here to enter BP  :1}***         Physical Exam:    VS:  LMP 10/24/2013     Wt Readings from Last 3 Encounters:  04/22/22 284 lb 12.8 oz (129.2 kg)  04/14/22 286 lb (129.7 kg)  09/04/19 298 lb 4 oz (135.3 kg)    GEN:  Morbid obesity no acute distress HEENT: Normal NECK: No JVD; No carotid bruits LYMPHATICS: No lymphadenopathy CARDIAC: RRR, no murmurs, rubs, gallops RESPIRATORY:  Clear to auscultation without rales, wheezing or rhonchi  ABDOMEN: Soft, non-tender, non-distended MUSCULOSKELETAL:  No edema; No deformity  SKIN: Warm and dry NEUROLOGIC:  Alert and oriented x 3 PSYCHIATRIC:  Normal affect   ASSESSMENT:    No diagnosis found.  PLAN:     HTN Cardiomegaly NOS - reviewed her CT with family; I suspect she has LVH  - will get echo - will start amlodipine 5mg   She has a thryroid nodule on that CT that needs outpatient follow up  HLD - LDL goal < 70; needs fasting lipids  Does not have health insurance - need PCP to lead care - limited by lack of insurance - we have reached out to social work - we have deferred Pharm D eval for now  If Echo is ok and has PCP in place, PRN f/u If not will get APP f/u in 5-6 months At that time will need interpretor.   Time Spent Directly with Patient:   I have spent a total of 60 minutes with the patient reviewing notes, imaging, EKGs, labs and examining the patient as well as establishing an assessment and plan that was discussed personally with the patient.  > 50% of time was spent in direct patient care and family and reviewing imaging with patient.          Medication Adjustments/Labs and Tests Ordered: Current medicines are reviewed at length with the patient today.  Concerns regarding medicines are outlined above.  No orders of the defined types were placed in this encounter.  No orders of the defined types were placed in this encounter.   There are no Patient Instructions on file for this visit.   Signed, Christell Constant, MD  09/29/2022 11:49 AM    [facility]
